# Patient Record
Sex: Female | Born: 2000 | Race: White | Hispanic: No | Marital: Single | Smoking: Former smoker
Health system: Southern US, Community
[De-identification: ages and names within clinical notes are randomized; demographics above are authoritative.]

## PROBLEM LIST (undated history)

## (undated) DIAGNOSIS — T7840XA Allergy, unspecified, initial encounter: Secondary | ICD-10-CM

## (undated) DIAGNOSIS — R630 Anorexia: Secondary | ICD-10-CM

## (undated) DIAGNOSIS — J45909 Unspecified asthma, uncomplicated: Secondary | ICD-10-CM

## (undated) DIAGNOSIS — F909 Attention-deficit hyperactivity disorder, unspecified type: Secondary | ICD-10-CM

## (undated) DIAGNOSIS — F319 Bipolar disorder, unspecified: Secondary | ICD-10-CM

## (undated) DIAGNOSIS — F32A Depression, unspecified: Secondary | ICD-10-CM

## (undated) DIAGNOSIS — F938 Other childhood emotional disorders: Secondary | ICD-10-CM

## (undated) DIAGNOSIS — L309 Dermatitis, unspecified: Secondary | ICD-10-CM

## (undated) DIAGNOSIS — L209 Atopic dermatitis, unspecified: Secondary | ICD-10-CM

## (undated) HISTORY — DX: Bipolar disorder, unspecified: F31.9

## (undated) HISTORY — DX: Depression, unspecified: F32.A

## (undated) HISTORY — PX: ADENOIDECTOMY: SUR15

## (undated) HISTORY — DX: Anorexia: R63.0

---
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
## 2021-01-04 ENCOUNTER — Ambulatory Visit: Payer: Medicaid Other | Admitting: Gastroenterology

## 2021-03-25 NOTE — Progress Notes (Deleted)
Referring Provider: Lanelle Bal, PA-C Primary Care Physician:  Lanelle Bal, PA-C Primary GI Physician: Dr. Gala Romney  No chief complaint on file.   HPI:   Deborah Fernandez is a 20 y.o. female presenting today for follow-up s/p EGD and colonoscopy.  She was last seen in our office at the time of initial consult on 07/10/2020 for lower abdominal pain, loose stools, rectal bleeding, nausea and vomiting, and dysphagia.  She reported 1 year history of postprandial crampy lower abdominal pain radiating to her back with no specific dietary triggers lasting 30 minutes to an hour.  If she can have a bowel movement, this did help, but did not alleviate her pain.  Also with intermittent bright red blood with every other bowel movement with blood in the toilet or toilet water without rectal pain.  Rectal bleeding has started about 6 months ago.  Stools tended to be on the looser side.  She would skip a day between bowel movements, but can have up to 2-3 bowel movements per day without nocturnal stools.  She had cut out a lot of red meat and dairy which was helping somewhat with her abdominal pain.  Also reported intermittent daily nausea with rare vomiting, not improved by omeprazole daily.  Also with sensation of food getting hung at the sternal notch.  Denied upper abdominal pain or weight loss.  Suspected patient had IBS, but recommended colonoscopy to rule out IBD and EGD to evaluate her nausea and intermittent vomiting as well as dysphagia, update labs, continue omeprazole 40 mg daily, start dicyclomine 10 mg up to 3 times daily before meals and at bedtime.  Prior evaluation with celiac testing and alpha gal testing negative with PCP.  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 No gallstones.   Labs completed 07/24/2020: Lipase 81, CRP 3, sed rate 26, CMP essentially normal, CBC normal.  Procedures completed  07/30/2020: Colonoscopy: Normal examined colon, nonspecific findings of the TI s/p biopsy, lax anal sphincter tone.  Pathology was benign. EGD: Normal examined esophagus s/p empiric dilation and biopsied, hiatal hernia, otherwise normal exam.  Pathology was benign.  Recommended increasing omeprazole to 40 mg twice daily.  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  Hospitalized August-October 2017.   Bipolar disorder ([place])    Depression    Eczema 01/30/2015    Past Surgical History:  Procedure Laterality Date   ADENOIDECTOMY     BIOPSY  07/30/2020   Procedure: BIOPSY;  Surgeon: Daneil Dolin, MD;  [facility]: [facility];  Service: Endoscopy;;   COLONOSCOPY WITH PROPOFOL N/A 07/30/2020   Procedure: COLONOSCOPY WITH PROPOFOL;  Surgeon: Daneil Dolin, MD;  Location: [facility];  Service: Endoscopy;  Laterality: N/A;  am or pm   ESOPHAGOGASTRODUODENOSCOPY (EGD) WITH PROPOFOL N/A 07/30/2020   Procedure: ESOPHAGOGASTRODUODENOSCOPY (EGD) WITH PROPOFOL;  Surgeon: Daneil Dolin, MD;  [facility]: [facility];  Service: Endoscopy;  Laterality: N/A;   MALONEY DILATION N/A 07/30/2020   Procedure: Venia Minks DILATION;  Surgeon: Daneil Dolin, MD;  [facility]: [facility];  Service: Endoscopy;  Laterality: N/A;    Current Outpatient Medications  Medication Sig Dispense Refill   Acetaminophen 500 MG capsule Take 1,000-2,000 mg by mouth every 6 (six) hours as needed for mild pain, fever or headache.     albuterol (VENTOLIN HFA) 108 (90 Base) MCG/ACT inhaler Inhale 2 puffs into the lungs every 6 (six) hours as needed for wheezing or  shortness of breath. 18 g 2   DUPIXENT 300 MG/2ML SOPN Inject 300 mg into the skin every 14 (fourteen) days.     fluticasone (FLOVENT HFA) 110 MCG/ACT inhaler Inhale 2 puffs into the lungs 2 (two) times daily. (Patient taking differently: Inhale 2 puffs into the lungs daily as needed (Shortness of breath).) 1 each 5   lamoTRIgine (LAMICTAL) 25 MG tablet Take 25 mg by mouth daily.     LATUDA 40 MG TABS tablet Take 40 mg by mouth at bedtime.     levonorgestrel-ethinyl estradiol (ALESSE) 0.1-20 MG-MCG tablet Take by mouth. (Patient not taking: Reported on 07/10/2020)     omeprazole (PRILOSEC) 40 MG capsule Take 40 mg by mouth daily as needed (heartburn).     ondansetron (ZOFRAN) 4 MG tablet Take 4 mg by mouth every 8 (eight) hours as needed for nausea or vomiting.     triamcinolone ointment (KENALOG) 0.1 % Apply 1 application topically 2 (two) times daily. compounded 1:1 with Eucerin (Patient taking differently: Apply 1 application topically at bedtime. compounded 1:1 with Eucerin) 453.6 g 2   No current facility-administered medications for this visit.    Allergies as of 03/26/2021 - Review Complete 10/31/2020  Allergen Reaction Noted   Clindamycin/lincomycin Hives 09/12/2018   Keflex [cephalexin] Hives 01/25/2015   Propylene glycol Hives 01/25/2015   Nickel Swelling 12/16/2015    Family History  Problem Relation Age of Onset   Drug abuse Father    Mental illness Father    Anxiety disorder Sister    Depression Sister    OCD Mother    Myasthenia gravis Mother    Anxiety disorder Mother    Allergic rhinitis Neg Hx    Asthma Neg Hx    Eczema Neg Hx    Urticaria Neg Hx    Colon cancer Neg Hx    Inflammatory bowel disease Neg Hx     Social History   Socioeconomic History   Marital status: Single    Spouse name: Not on file   Number of children: Not on file   Years of education: Not on file   Highest education level: Not on file  Occupational History   Not on file  Tobacco Use    Smoking status: Former    Types: Cigarettes   Smokeless tobacco: Never   Tobacco comments:    Pt states it was habitually, just occasionally. Reports she has quit smoking.   Vaping Use   Vaping Use: Every day  Substance and Sexual Activity   Alcohol use: Yes    Comment: rare   Drug use: Yes    Types: Marijuana, Other-see comments    Comment: "pain medications"- not currently   Sexual activity: Never    Birth control/protection: Abstinence  Other Topics Concern   Not on file  Social History Narrative   Pt lives with mother, step-father, older sister and younger brother. No pets in the home.    Social Determinants of Health   Financial Resource Strain: Not on file  Food Insecurity: Not on file  Transportation Needs: Not on file  Physical Activity: Not  on file  Stress: Not on file  Social Connections: Not on file    Review of Systems: Gen: Denies fever, chills, anorexia. Denies fatigue, weakness, weight loss.  CV: Denies chest pain, palpitations, syncope, peripheral edema, and claudication. Resp: Denies dyspnea at rest, cough, wheezing, coughing up blood, and pleurisy. GI: Denies vomiting blood, jaundice, and fecal incontinence.   Denies dysphagia or odynophagia. Derm: Denies rash, itching, dry skin Psych: Denies depression, anxiety, memory loss, confusion. No homicidal or suicidal ideation.  Heme: Denies bruising, bleeding, and enlarged lymph nodes.  Physical Exam: There were no vitals taken for this visit. General:   Alert and oriented. No distress noted. Pleasant and cooperative.  Head:  Normocephalic and atraumatic. Eyes:  Conjuctiva clear without scleral icterus. Mouth:  Oral mucosa pink and moist. Good dentition. No lesions. Heart:  S1, S2 present without murmurs appreciated. Lungs:  Clear to auscultation bilaterally. No wheezes, rales, or rhonchi. No distress.  Abdomen:  +BS, soft, non-tender and non-distended. No rebound or guarding. No HSM or masses  noted. Msk:  Symmetrical without gross deformities. Normal posture. Extremities:  Without edema. Neurologic:  Alert and  oriented x4 Psych:  Alert and cooperative. Normal mood and affect.

## 2021-03-26 ENCOUNTER — Encounter: Payer: Self-pay | Admitting: Internal Medicine

## 2021-03-26 ENCOUNTER — Ambulatory Visit: Payer: Medicaid Other | Admitting: Gastroenterology

## 2021-05-07 ENCOUNTER — Ambulatory Visit (INDEPENDENT_AMBULATORY_CARE_PROVIDER_SITE_OTHER): Payer: Medicaid Other | Admitting: Allergy & Immunology

## 2021-05-07 ENCOUNTER — Encounter: Payer: Self-pay | Admitting: Allergy & Immunology

## 2021-05-07 ENCOUNTER — Other Ambulatory Visit: Payer: Self-pay

## 2021-05-07 VITALS — BP 108/66 | HR 106 | Temp 98.0°F | Resp 18 | Ht 64.0 in | Wt 149.6 lb

## 2021-05-07 DIAGNOSIS — J3089 Other allergic rhinitis: Secondary | ICD-10-CM | POA: Diagnosis not present

## 2021-05-07 DIAGNOSIS — B999 Unspecified infectious disease: Secondary | ICD-10-CM | POA: Diagnosis not present

## 2021-05-07 DIAGNOSIS — J302 Other seasonal allergic rhinitis: Secondary | ICD-10-CM

## 2021-05-07 DIAGNOSIS — J4530 Mild persistent asthma, uncomplicated: Secondary | ICD-10-CM

## 2021-05-07 DIAGNOSIS — L2089 Other atopic dermatitis: Secondary | ICD-10-CM

## 2021-05-07 MED ORDER — FLUTICASONE PROPIONATE HFA 110 MCG/ACT IN AERO: 2.0000 | INHALATION_SPRAY | Freq: Two times a day (BID) | RESPIRATORY_TRACT | 5 refills | Status: DC

## 2021-05-07 MED ORDER — LEVOCETIRIZINE DIHYDROCHLORIDE 5 MG PO TABS: 5.0000 mg | ORAL_TABLET | Freq: Every evening | ORAL | 5 refills | Status: DC

## 2021-05-07 MED ORDER — ALBUTEROL SULFATE HFA 108 (90 BASE) MCG/ACT IN AERS: 2.0000 | INHALATION_SPRAY | Freq: Four times a day (QID) | RESPIRATORY_TRACT | 2 refills | Status: DC | PRN

## 2021-05-07 MED ORDER — TRIAMCINOLONE ACETONIDE 0.1 % EX OINT: 1.0000 "application " | TOPICAL_OINTMENT | Freq: Two times a day (BID) | CUTANEOUS | 2 refills | Status: DC

## 2021-05-07 NOTE — Progress Notes (Signed)
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 Seasonal and perennial allergic rhinitis (grasses, ragweed, weeds, trees, indoor molds, outdoor molds, dust mites, cat, dog and cockroach) - Continue to hold allergy shots (clearly they have helped you!).  - Continue with: Xyzal (levocetirizine) 5mg  tablet once daily  2. Mild persistent asthma, uncomplicated - Lung testing looked MUCH better than last time (almost normal, something that is NEVER used to describe you! LOL) - Daily controller medication(s): NOTHING - Prior to physical activity: albuterol 2 puffs 10-15 minutes before physical activity. - Rescue medications: albuterol 4 puffs every 4-6 hours as needed - Changes during respiratory infections or worsening symptoms: Add on Flovent 151mcg to 4 puffs twice daily for TWO WEEKS. - Asthma control goals:  * Full participation in all desired activities (may need albuterol before activity) * Albuterol use two time or less a week on average (not counting use with activity) * Cough interfering with sleep two time or less a month * Oral steroids no more than once a year * No hospitalizations  3. Eczema with contact dermatitis  - We will hold off Dupixent for now. - Continue to moisturize with chicken grease.  4. Recurrent infections - These seem to be under good control. - Keep it  up.  5. Return in about 6 months (around 11/04/2021).    Subjective:   Deborah Fernandez is a 21 y.o. female presenting today for follow up of  Chief Complaint  Patient presents with   Asthma    Says last summer it was good. Now that it is colder it is slightly worse.     Deborah Fernandez has a history of the following: Patient Active Problem List   Diagnosis Date Noted   Rectal bleeding 07/10/2020   Dysphagia 07/10/2020   Loose stools 07/10/2020   Mild intermittent asthma with acute exacerbation 05/29/2020   Seasonal and perennial allergic rhinitis 05/29/2020   Flexural atopic dermatitis 05/29/2020   On total parenteral nutrition    Liver abscess    Spleen, abscess    Abdominal pain    Recurrent infections    Dehydration    Diarrhea    PICC (peripherally inserted central catheter) in place    Weight loss 12/15/2015   Fever 12/15/2015   Nausea with vomiting    Pyrexia 12/14/2015   Attention deficit hyperactivity disorder (ADHD) 07/07/2015   Major depression 04/08/2015   Eczema 01/30/2015   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  Deborah Fernandez is a 21 y.o. female presenting for a follow up visit.  She was last seen in July 2022.  At that time, we continue with her allergy shots.  She was not the most compliant with regards to coming on a regular basis.  We continue with Xyzal as well as Singulair, Flonase, and Astelin.  Her asthma was under good control with albuterol  as needed and Flovent added during flares.  Her eczema was controlled with Dupixent every 2 weeks, which she administered at home.  For her recurrent infections, we recommended getting a repeat streptococcal titer.  She did get this lab drawn and she was protective to 19 out of 23 serotypes of Streptococcus pneumonia.  Since the last visit, she has largely done well.  Asthma/Respiratory Symptom History: She has not been  using her albuterol at all. Deborah Fernandez's asthma has been well controlled. She has not required rescue medication, experienced nocturnal awakenings due to lower respiratory symptoms, nor have activities of daily living been limited. She has required no Emergency Department or Urgent Care visits for her asthma. She has required zero courses of systemic steroids for asthma exacerbations since the last visit. ACT score today is 25, indicating excellent asthma symptom control. She has not needed to add her Flovent in quite some time.   Allergic Rhinitis Symptom History: She is not on her allergy shots anymore.  She feels that her allergy shots have been really well to control her symptoms.  She is now able to be around animals without any problems. She is not doing Singulair.  She is not using her nasal sprays because she does not like nasal sprays at all.  She is using her Xyzal every day.  Skin Symptom History: She last used Dupixent two months ago. She does not the pain. She has one spot on her big toe.  She really is happy with how she is doing from a skin perspective.  It certainly not at the same severity as when I saw her initially.  At that time, she had severe eczema on both of her feet. She does moisturize occasionally.  However, she tells me that she goes around [place] with her shoes off sometimes.  This is fairly gross.  She got Macrobid on Monday for a UTI. Her last sinus infection was October and she did not need antibiotics. She did have MRSA last month and this was treated with Bactroban only.  Otherwise, she has not needed systemic antibiotics for any sinopulmonary infections or skin infections.  She has not been to the emergency room or hospitalized for any infections.  She is still working at [employer].  She is actually been at this job for quite some time.  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  She does not want to do fast food long-term, but expand the bills now.  She  was in college but failed out because she never went to classes.  She is thinking of going to trade school, likely something in Risk analyst.  Her husband is doing well.  He was not impressed with the former.  This seems to be going well for him.  They found acute 1 bedroom studio apartment which is affordable at 450 a month.  Otherwise, there have been no changes to her past medical history, surgical history, family history, or social history.    Review of Systems  Constitutional: Negative.  Negative for chills, fever, malaise/fatigue and weight loss.  HENT: Negative.  Negative for congestion, ear discharge and ear pain.   Eyes:  Negative for pain, discharge and redness.  Respiratory:  Negative for cough, sputum production, shortness of breath and wheezing.   Cardiovascular: Negative.  Negative for chest pain and palpitations.  Gastrointestinal:  Negative for abdominal pain, constipation, diarrhea, heartburn, nausea and vomiting.  Skin: Negative.  Negative for itching and rash.  Neurological:  Negative for dizziness and headaches.  Endo/Heme/Allergies:  Negative for environmental allergies. Does not bruise/bleed easily.      Objective:   Blood pressure 108/66, pulse (!) 106, temperature 98 F (36.7 C), temperature source Temporal, resp. rate 18, height 5\' 4"  (1.626 m), weight 149 lb 9.6 oz (67.9 kg), SpO2 100 %. Body mass index is 25.68 kg/m.   Physical Exam Vitals reviewed.  Constitutional:      Appearance: She is well-developed.     Comments: Amusing and engaging.  HENT:     Head: Normocephalic and atraumatic.     Right Ear: Tympanic membrane, ear canal and external ear normal.     Left Ear: Tympanic membrane, ear canal and external ear normal.     Nose: No nasal deformity, septal deviation, mucosal edema or rhinorrhea.     Right Turbinates: Enlarged, swollen and pale.     Left Turbinates: Enlarged, swollen and pale.     Right Sinus: No maxillary sinus tenderness or  frontal sinus tenderness.     Left Sinus: No maxillary sinus tenderness or frontal sinus tenderness.     Mouth/Throat:     Mouth: Mucous membranes are not pale and not dry.     Pharynx: Uvula midline.  Eyes:     General: Lids are normal. No allergic shiner.       Right eye: No discharge.        Left eye: No discharge.     Conjunctiva/sclera: Conjunctivae normal.     Right eye: Right conjunctiva is not injected. No chemosis.    Left eye: Left conjunctiva is not injected. No chemosis.    Pupils: Pupils are equal, round, and reactive to light.  Cardiovascular:     Rate and Rhythm: Normal rate and regular rhythm.     Heart sounds: Normal heart sounds.  Pulmonary:     Effort: Pulmonary effort is normal. No tachypnea, accessory muscle usage or respiratory distress.     Breath sounds: Normal breath sounds. No wheezing, rhonchi or rales.  Chest:     Chest wall: No tenderness.  Lymphadenopathy:     Cervical: No cervical adenopathy.  Skin:    General: Skin is warm.     Capillary Refill: Capillary refill takes less than 2 seconds.     Coloration: Skin is not pale.     Findings: No abrasion, erythema, petechiae or rash. Rash is not papular, urticarial or vesicular.     Comments: Skin looks fairly good.  She does have eczematous patch on each, but otherwise she looks great.  Neurological:     Mental Status: She is alert.  Psychiatric:        Behavior: Behavior is cooperative.     Diagnostic studies:    Spirometry: results normal (FEV1: 2.60/78%, FVC: 3.55/94%, FEV1/FVC: 73%).    Spirometry consistent with mild obstructive disease.    Allergy Studies: none         Salvatore Marvel, MD  [facility]

## 2021-05-07 NOTE — Patient Instructions (Addendum)
1. Seasonal and perennial allergic rhinitis (grasses, ragweed, weeds, trees, indoor molds, outdoor molds, dust mites, cat, dog and cockroach) - Continue to hold allergy shots (clearly they have helped you!).  - Continue with: Xyzal (levocetirizine) 5mg  tablet once daily  2. Mild persistent asthma, uncomplicated - Lung testing looked MUCH better than last time (almost normal, something that is NEVER used to describe you! LOL) - Daily controller medication(s): NOTHING - Prior to physical activity: albuterol 2 puffs 10-15 minutes before physical activity. - Rescue medications: albuterol 4 puffs every 4-6 hours as needed - Changes during respiratory infections or worsening symptoms: Add on Flovent to 4 puffs twice daily for TWO WEEKS. - Asthma control goals:  * Full participation in all desired activities (may need albuterol before activity) * Albuterol use two time or less a week on average (not counting use with activity) * Cough interfering with sleep two time or less a month * Oral steroids no more than once a year * No hospitalizations  3. Eczema with contact dermatitis  - We will hold off Dupixent for now. - Continue to moisturize with chicken grease.  4. Recurrent infections - These seem to be under good control. - Keep it up.  5. Return in about 6 months (around 11/04/2021).    Please inform 11/06/2021 of any Emergency Department visits, hospitalizations, or changes in symptoms. Call us before going to the ED for breathing or allergy symptoms since we might be able to fit you in for a sick visit. Feel free to contact us anytime with any questions, problems, or concerns.  It was a pleasure to see you again today!  Websites that have reliable patient information: 1. American Academy of Asthma, Allergy, and Immunology: www.aaaai.org 2. Food Allergy Research and Education (FARE): foodallergy.org 3. Mothers of Asthmatics: http://www.asthmacommunitynetwork.org 4. American College of  Allergy, Asthma, and Immunology: www.acaai.org   COVID-19 Vaccine Information can be found at: [place] For questions related to vaccine distribution or appointments, please email [email or web address] or call [phone].   We realize that you might be concerned about having an allergic reaction to the COVID19 vaccines. To help with that concern, WE ARE OFFERING THE COVID19 VACCINES IN OUR OFFICE! Ask the front desk for dates!     Like [phone] on [place] and Instagram for our latest updates!      A healthy democracy works best when [employer] participate! Make sure you are registered to vote! If you have moved or changed any of your contact information, you will need to get this updated before voting!  In some cases, you MAY be able to register to vote online: [employer]

## 2021-08-04 ENCOUNTER — Other Ambulatory Visit: Payer: Self-pay | Admitting: Allergy & Immunology

## 2021-11-05 ENCOUNTER — Encounter: Payer: Self-pay | Admitting: Allergy & Immunology

## 2021-11-05 ENCOUNTER — Ambulatory Visit (INDEPENDENT_AMBULATORY_CARE_PROVIDER_SITE_OTHER): Payer: Medicaid Other | Admitting: Allergy & Immunology

## 2021-11-05 VITALS — BP 108/82 | HR 85 | Temp 97.8°F | Resp 16 | Ht 62.75 in | Wt 133.5 lb

## 2021-11-05 DIAGNOSIS — J302 Other seasonal allergic rhinitis: Secondary | ICD-10-CM

## 2021-11-05 DIAGNOSIS — J4530 Mild persistent asthma, uncomplicated: Secondary | ICD-10-CM | POA: Diagnosis not present

## 2021-11-05 DIAGNOSIS — B999 Unspecified infectious disease: Secondary | ICD-10-CM

## 2021-11-05 DIAGNOSIS — J3089 Other allergic rhinitis: Secondary | ICD-10-CM

## 2021-11-05 DIAGNOSIS — L2089 Other atopic dermatitis: Secondary | ICD-10-CM

## 2021-11-05 MED ORDER — LEVOCETIRIZINE DIHYDROCHLORIDE 5 MG PO TABS: 5.0000 mg | ORAL_TABLET | Freq: Every evening | ORAL | 10 refills | Status: DC

## 2021-11-05 MED ORDER — ALBUTEROL SULFATE HFA 108 (90 BASE) MCG/ACT IN AERS: INHALATION_SPRAY | RESPIRATORY_TRACT | 2 refills | Status: DC

## 2021-11-05 MED ORDER — FLUTICASONE PROPIONATE HFA 110 MCG/ACT IN AERO: INHALATION_SPRAY | RESPIRATORY_TRACT | 5 refills | Status: DC

## 2021-11-05 NOTE — Patient Instructions (Addendum)
1. Seasonal and perennial allergic rhinitis (grasses, ragweed, weeds, trees, indoor molds, outdoor molds, dust mites, cat, dog and cockroach) - You are doing so good!  - Continue with: Xyzal (levocetirizine) 5mg  tablet once daily  2. Mild persistent asthma, uncomplicated - Lung testing looks good today.  - Daily controller medication(s): NOTHING - Prior to physical activity: albuterol 2 puffs 10-15 minutes before physical activity. - Rescue medications: albuterol 4 puffs every 4-6 hours as needed - Changes during respiratory infections or worsening symptoms: Add on Flovent to 4 puffs twice daily for TWO WEEKS. - Asthma control goals:  * Full participation in all desired activities (may need albuterol before activity) * Albuterol use two time or less a week on average (not counting use with activity) * Cough interfering with sleep two time or less a month * Oral steroids no more than once a year * No hospitalizations  3. Eczema with contact dermatitis  - We will hold off Dupixent for now. - Continue to moisturize with chicken grease. - You have such nice feet today!     4. Recurrent infections - These seem to be under good control. - Keep it up.  5. Return in about 1 year (around 11/06/2022).    Please inform 11/08/2022 of any Emergency Department visits, hospitalizations, or changes in symptoms. Call us before going to the ED for breathing or allergy symptoms since we might be able to fit you in for a sick visit. Feel free to contact us anytime with any questions, problems, or concerns.  It was a pleasure to see you again today!  Websites that have reliable patient information: 1. American Academy of Asthma, Allergy, and Immunology: www.aaaai.org 2. Food Allergy Research and Education (FARE): foodallergy.org 3. Mothers of Asthmatics: http://www.asthmacommunitynetwork.org 4. American College of Allergy, Asthma, and Immunology: www.acaai.org   COVID-19 Vaccine Information can be  found at: [place] For questions related to vaccine distribution or appointments, please email [email or web address] or call [phone].   We realize that you might be concerned about having an allergic reaction to the COVID19 vaccines. To help with that concern, WE ARE OFFERING THE COVID19 VACCINES IN OUR OFFICE! Ask the front desk for dates!     "Like" [phone] on Facebook and Instagram for our latest updates!      A healthy democracy works best when [place] participate! Make sure you are registered to vote! If you have moved or changed any of your contact information, you will need to get this updated before voting!  In some cases, you MAY be able to register to vote online: [employer]

## 2021-11-05 NOTE — Progress Notes (Signed)
FOLLOW UP  Date of Service/Encounter:  11/05/21   Assessment:   Mild persistent allergic atopic asthma without complication  Seasonal and perennial allergic rhinitis (grasses, ragweed, weeds, trees, indoor molds, outdoor molds, dust mites, cat, dog and cockroach)  Recurrent infections - with excellent response to Pneumovax  Flexural atopic dermatitis - well controlled following Dupixent and two years of allergen immunotherapy   Plan/Recommendations:   1. Seasonal and perennial allergic rhinitis (grasses, ragweed, weeds, trees, indoor molds, outdoor molds, dust mites, cat, dog and cockroach) - You are doing so good!  - Continue with: Xyzal (levocetirizine) 5mg  tablet once daily  2. Mild persistent asthma, uncomplicated - Lung testing looks good today.  - Daily controller medication(s): NOTHING - Prior to physical activity: albuterol 2 puffs 10-15 minutes before physical activity. - Rescue medications: albuterol 4 puffs every 4-6 hours as needed - Changes during respiratory infections or worsening symptoms: Add on Flovent to 4 puffs twice daily for TWO WEEKS. - Asthma control goals:  * Full participation in all desired activities (may need albuterol before activity) * Albuterol use two time or less a week on average (not counting use with activity) * Cough interfering with sleep two time or less a month * Oral steroids no more than once a year * No hospitalizations  3. Eczema with contact dermatitis  - We will hold off Dupixent for now. - Continue to moisturize with chicken grease. - Skin looks amazing today.  4. Recurrent infections - These seem to be under good control. - Keep it up.  5. Return in about 1 year (around 11/06/2022).   Subjective:   Deborah Fernandez is a 21 y.o. female presenting today for follow up of  Chief Complaint  Patient presents with   Asthma    Asthma is doing good. With the air quality she has used her alb. A few times.    Eczema     Eczema has been clear most of the time.    Allergic Rhinitis     Some days she is sneezing but overall doing well.     Deborah Fernandez has a history of the following: Patient Active Problem List   Diagnosis Date Noted   Rectal bleeding 07/10/2020   Dysphagia 07/10/2020   Loose stools 07/10/2020   Mild intermittent asthma with acute exacerbation 05/29/2020   Seasonal and perennial allergic rhinitis 05/29/2020   Flexural atopic dermatitis 05/29/2020   On total parenteral nutrition    Liver abscess    Spleen, abscess    Abdominal pain    Recurrent infections    Dehydration    Diarrhea    PICC (peripherally inserted central catheter) in place    Weight loss 12/15/2015   Fever 12/15/2015   Nausea with vomiting    Pyrexia 12/14/2015   Attention deficit hyperactivity disorder (ADHD) 07/07/2015   Major depression 04/08/2015   Eczema 01/30/2015   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  Deborah Fernandez is a 21 y.o. female presenting for a follow up visit. She was last seen in January 2023. At that time, she was doing well without her allergy shots. We continued with levocetirizine 5 mg daily as well as albuterol as needed for her asthma. Lung testing looked excellent. For her eczema, we continued with the use of militarization. She had previously been on Dupixent with excellent results. Recurrent infections were much better following her vaccination with Pneumovax.  Since the last visit, she has done very well.   Asthma/Respiratory Symptom History: She has not been using her albuterol much at all. Deborah Fernandez's asthma has been well controlled. She has not required rescue medication, experienced nocturnal awakenings due to lower respiratory symptoms, nor have activities of daily living been limited. She has required no Emergency Department or Urgent Care visits for her asthma. She has required zero  courses of systemic steroids for asthma exacerbations since the last visit. ACT score today is 25, indicating excellent asthma symptom control. She has not been on prednisone at alol since the last visit. She denies any problems with her breathing, despite her continued smoking.   Allergic Rhinitis Symptom History: For her allergic rhinitis, she remains on the levocetirzine daily. She has not been on any antibiotics at all since the last visit. She has not been using her nasal steroid at all.  She was on allergy shots for a couple of years in total.   Skin Symptom History: Skin has been under good control with her current regimen. She does not use her Dupxient at all. Her feet are looking clear without any inflammation. She does not need refills of her topical treatments at all.   She continues to work at [employer] which is the longest place that she has worked since I have known her. She is coming up on one year of marriage. She dabbles in illicit drugs including mushrooms, but she uses pot much more regularly. She is planning to do some type of higher education, but she is still making up her mind. Her husband continues to apprentice with a Nutritional therapist.   Otherwise, there have been no changes to her past medical history, surgical history, family history, or social history.    Review of Systems  Constitutional: Negative.  Negative for chills, fever, malaise/fatigue and weight loss.  HENT: Negative.  Negative for congestion, ear discharge and ear pain.   Eyes:  Negative for pain, discharge and redness.  Respiratory:  Negative for cough, sputum production, shortness of breath and wheezing.   Cardiovascular: Negative.  Negative for chest pain and palpitations.  Gastrointestinal:  Negative for abdominal pain, constipation, diarrhea, heartburn, nausea and vomiting.  Skin: Negative.  Negative for itching and rash.  Neurological:  Negative for dizziness and headaches.  Endo/Heme/Allergies:  Negative for  environmental allergies. Does not bruise/bleed easily.       Objective:   Blood pressure 108/82, pulse 85, temperature 97.8 F (36.6 C), resp. rate 16, height 5' 2.75" (1.594 m), weight 133 lb 8 oz (60.6 kg), SpO2 100 %. Body mass index is 23.84 kg/m.    Physical Exam Vitals reviewed.  Constitutional:      Appearance: She is well-developed.     Comments: Amusing and engaging.  HENT:     Head: Normocephalic and atraumatic.     Right Ear: Tympanic membrane, ear canal and external ear normal.     Left Ear: Tympanic membrane, ear canal and external ear normal.     Nose: No nasal deformity, septal deviation, mucosal edema or rhinorrhea.     Right Turbinates: Enlarged, swollen and pale.     Left Turbinates: Enlarged, swollen and pale.     Right Sinus: No maxillary sinus tenderness or frontal sinus tenderness.     Left Sinus: No maxillary sinus tenderness or frontal sinus tenderness.     Mouth/Throat:     Mouth: Mucous membranes are not pale and not dry.     Pharynx: Uvula midline.  Eyes:     General: Lids are normal. No allergic shiner.       Right eye: No discharge.        Left eye: No discharge.     Conjunctiva/sclera: Conjunctivae normal.     Right eye: Right conjunctiva is not injected. No chemosis.    Left eye: Left conjunctiva is not injected. No chemosis.    Pupils: Pupils are equal, round, and reactive to light.  Cardiovascular:     Rate and Rhythm: Normal rate and regular rhythm.     Heart sounds: Normal heart sounds.  Pulmonary:     Effort: Pulmonary effort is normal. No tachypnea, accessory muscle usage or respiratory distress.     Breath sounds: Normal breath sounds. No wheezing, rhonchi or rales.  Chest:     Chest wall: No tenderness.  Lymphadenopathy:     Cervical: No cervical adenopathy.  Skin:    General: Skin is warm.     Capillary Refill: Capillary refill takes less than 2 seconds.     Coloration: Skin is not pale.     Findings: No abrasion, erythema,  petechiae or rash. Rash is not papular, urticarial or vesicular.     Comments: Skin looks fairly good.  She does have eczematous patch on each, but otherwise she looks great.  Neurological:     Mental Status: She is alert.  Psychiatric:        Behavior: Behavior is cooperative.      Diagnostic studies:    Spirometry: Normal FEV1, FVC, and FEV1/FVC ratio. There is no scooping suggestive of obstructive disease.        Malachi Bonds, MD  [facility]

## 2021-11-07 ENCOUNTER — Encounter: Payer: Self-pay | Admitting: Allergy & Immunology

## 2022-01-29 ENCOUNTER — Other Ambulatory Visit: Payer: Self-pay | Admitting: Allergy & Immunology

## 2022-02-11 ENCOUNTER — Telehealth: Payer: Self-pay

## 2022-02-11 MED ORDER — BUDESONIDE 180 MCG/ACT IN AEPB: 1.0000 | INHALATION_SPRAY | Freq: Two times a day (BID) | RESPIRATORY_TRACT | 5 refills | Status: DC

## 2022-02-11 MED ORDER — ALBUTEROL SULFATE HFA 108 (90 BASE) MCG/ACT IN AERS: 2.0000 | INHALATION_SPRAY | Freq: Four times a day (QID) | RESPIRATORY_TRACT | 2 refills | Status: DC | PRN

## 2022-02-11 NOTE — Telephone Encounter (Signed)
I received a call from Nambe at [facility]. The patient was seen by their office today due to increased chest tightness . Patient is currently using her rescue inhaler multiple times a day. Launa Grill, PA is wondering if she can restart a rescue inhaler? If so which one and also patient has pending OB labs to see if she is pregnant.   Please call Judson Roch as she will update the patient and send in the medication refill.   Clarise Cruz @ [facility] [phone]

## 2022-02-11 NOTE — Telephone Encounter (Signed)
That is a good idea. I would do Pulmicort 166mcg one puff BID (since this is safe to use during pregnancy).   I would also add on albuterol back onto her regimen.   I called the number and the office was closed. Therefore I called Miller herself instead and told her the plan.   Salvatore Marvel, MD [facility]

## 2022-02-16 NOTE — Telephone Encounter (Signed)
[facility] called back and has been informed.

## 2022-03-16 NOTE — Telephone Encounter (Signed)
Patient called today due to being seen in the ED for an asthma flare. Patient was prescribed ned solution but does not have a nebulizer. She was told to call her asthma doctor for one. I have one for her ready for pick up at the [facility] office. Patient plans to pick it up a little after 1:30pm.

## 2022-03-17 NOTE — Telephone Encounter (Signed)
Lm for pt to call us back about getting on schedule to do televisit today

## 2022-03-17 NOTE — Telephone Encounter (Signed)
Do we need to get her in to discuss her medications? She seems to be flaring a lot more. We could do a televisit today if needed.   Deborah Bonds, MD [facility]

## 2022-03-18 NOTE — Telephone Encounter (Signed)
Called and spoke with patient and she states that she is feeling much better right now. I did get her scheduled to see Dr. Allena Katz this coming Monday in [place] to evaluate her.

## 2022-03-18 NOTE — Telephone Encounter (Signed)
Great - sounds good! I bet Dr. Allena Katz will love Potato Girl! I think her asthma has just flared due to her pregnancy, but we will get to work getting her on the right medications.  Dr. Allena Katz - please wish Potato Girl a happy birthday for me when you see her on Monday!   Malachi Bonds, MD [facility]

## 2022-03-21 ENCOUNTER — Encounter: Payer: Self-pay | Admitting: Internal Medicine

## 2022-03-21 ENCOUNTER — Telehealth: Payer: Self-pay

## 2022-03-21 ENCOUNTER — Ambulatory Visit (INDEPENDENT_AMBULATORY_CARE_PROVIDER_SITE_OTHER): Payer: Medicaid Other | Admitting: Internal Medicine

## 2022-03-21 VITALS — BP 110/60 | HR 95 | Temp 98.7°F | Resp 18 | Ht 63.74 in | Wt 136.4 lb

## 2022-03-21 DIAGNOSIS — J4530 Mild persistent asthma, uncomplicated: Secondary | ICD-10-CM

## 2022-03-21 DIAGNOSIS — J3089 Other allergic rhinitis: Secondary | ICD-10-CM

## 2022-03-21 DIAGNOSIS — J302 Other seasonal allergic rhinitis: Secondary | ICD-10-CM

## 2022-03-21 MED ORDER — ALBUTEROL SULFATE (2.5 MG/3ML) 0.083% IN NEBU: 2.5000 mg | INHALATION_SOLUTION | Freq: Four times a day (QID) | RESPIRATORY_TRACT | 1 refills | Status: DC | PRN

## 2022-03-21 MED ORDER — ALBUTEROL SULFATE HFA 108 (90 BASE) MCG/ACT IN AERS: 2.0000 | INHALATION_SPRAY | Freq: Four times a day (QID) | RESPIRATORY_TRACT | 2 refills | Status: DC | PRN

## 2022-03-21 MED ORDER — PULMICORT FLEXHALER 180 MCG/ACT IN AEPB: INHALATION_SPRAY | RESPIRATORY_TRACT | 5 refills | Status: DC

## 2022-03-21 NOTE — Telephone Encounter (Signed)
Per Dr.Patel. I called [facility] and asked why the Pulmicort Inhaler could not be dispensed the last time she tried to pick it up. Pharmacist said that a PA is required for the budesonide(pulmicort) inhaler.

## 2022-03-21 NOTE — Telephone Encounter (Signed)
Pulmicort Flexhaler 180 mcg PA...  KEY: [id number] PA Case ID: [id number] created: 03/21/22   Approved  today  PA Case: [id number], Status: Approved, Coverage Starts on: 03/21/2022 12:00:00 AM, Coverage Ends on: 03/21/2023 12:00:00 AM.  Sending prescription to pharmacy - called and advised patient .

## 2022-03-21 NOTE — Progress Notes (Signed)
FOLLOW UP Date of Service/Encounter:  03/21/22   Subjective:  Deborah Fernandez (DOB: 10-29-2000) is a 21 y.o. female who [facility] for follow up for an acute visit after recent asthma exacerbation.     History obtained from: chart review and patient.  Last saw Dr. Dellis Anes 10/2021 : Mild persistent asthma: PRN albuterol, normal spirometry Allergic rhinitis: Xyzal 5mg  daily. Eczema: moisturizing  Since last visit, she reports her asthma has not been doing well.  She used to be on Dupixent and AIT and reports she was doing very well at the time.  However, since seeing Dr. and with her recent pregnancy (10 weeks), she has been having more episodes of shortness of breath and chest tightness.  She has tried Flovent inhaler which she had from prior visit but it does not help much.  She was seen 03/15/2022 in ER for asthma exacerbation and had wheezing on exam; she was given Duonebs and discharged home, did not require prednisone.    In terms of her allergic rhinitis, she reports doing well. She is not taking anything for it currently and has not had much congestion/rhinorrhea/itchy watery eyes.  Was previously on Xyzal 5mg  PRN.   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  Hospitalized August-October 2017.   Bipolar disorder (HCC)    Depression    Eczema 01/30/2015    Objective:  BP 110/60   Pulse 95   Temp 98.7 F (37.1 C) (Temporal)   Resp 18   Ht 5' 3.74" (1.619 m)   Wt 136 lb 6.4 oz (61.9 kg)   SpO2 98%   BMI 23.60 kg/m  Body mass index is 23.6 kg/m. Physical Exam: GEN: alert, well developed HEENT: clear conjunctiva, TM grey and translucent,  nose with moderate inferior turbinate hypertrophy, pink nasal mucosa, no rhinorrhea, no cobblestoning HEART: regular rate and rhythm, no murmur LUNGS: clear to auscultation bilaterally, no coughing, unlabored respiration SKIN: no rashes or lesions   Spirometry:  Tracings reviewed. Her effort: Good reproducible efforts. FVC: 3.25L FEV1: 2.59L, 80% predicted FEV1/FVC ratio: 80% Interpretation: Spirometry consistent with normal pattern.  Please see scanned spirometry results for details.  Assessment/Plan   Mild Persistent Asthma, Recent Exacerbation in Pregnancy, Uncontrolled  - MDI technique discussed.   - Maintenance inhaler:  start Pulmicort 01-12-2005 1 puff twice daily.   Has tried Flovent in the past without significant improvement.   - Rescue inhaler: Albuterol 2 puffs via spacer or 1 vial via nebulizer every 4-6 hours as needed for respiratory symptoms of cough, shortness of breath, or wheezing Asthma control goals:  Full participation in all desired activities (may need albuterol before activity) Albuterol use two times or less a week on average (not counting use with activity) Cough interfering with sleep two times or less a month Oral steroids no more than once a year No hospitalizations   Allergic Rhinitis: - Positive skin test 10/2019: grasses, ragweed, weeds, trees, indoor molds, outdoor molds, dust mites, cat, dog and cockroach  - Avoidance measures discussed. - Use nasal saline rinses before nose sprays such as with Neilmed Sinus Rinse.  Use distilled water.   - Stop Xyzal.  Okay to try Zyrtec 5mg  as needed if your symptoms are uncontrolled.    Return in about 2 months (around 05/22/2022). Harlon Flor, MD  [facility]

## 2022-03-21 NOTE — Patient Instructions (Addendum)
Asthma: - MDI technique discussed.   - Maintenance inhaler:  Pulmicort 1 puff twice daily.  - Rescue inhaler: Albuterol 2 puffs via spacer or 1 vial via nebulizer every 4-6 hours as needed for respiratory symptoms of cough, shortness of breath, or wheezing Asthma control goals:  Full participation in all desired activities (may need albuterol before activity) Albuterol use two times or less a week on average (not counting use with activity) Cough interfering with sleep two times or less a month Oral steroids no more than once a year No hospitalizations   Allergic Rhinitis: - Positive skin test 10/2019: grasses, ragweed, weeds, trees, indoor molds, outdoor molds, dust mites, cat, dog and cockroach  - Avoidance measures discussed. - Use nasal saline rinses before nose sprays such as with Neilmed Sinus Rinse.  Use distilled water.   - Stop Xyzal.  Okay to try Zyrtec 5mg  as needed if your symptoms are uncontrolled.

## 2022-05-25 ENCOUNTER — Encounter: Payer: Self-pay | Admitting: Allergy & Immunology

## 2022-05-25 ENCOUNTER — Other Ambulatory Visit: Payer: Self-pay

## 2022-05-25 ENCOUNTER — Telehealth: Payer: Self-pay | Admitting: *Deleted

## 2022-05-25 ENCOUNTER — Ambulatory Visit (INDEPENDENT_AMBULATORY_CARE_PROVIDER_SITE_OTHER): Payer: 59 | Admitting: Allergy & Immunology

## 2022-05-25 VITALS — BP 120/62 | HR 131 | Temp 98.4°F | Resp 20 | Ht 63.0 in | Wt 145.0 lb

## 2022-05-25 DIAGNOSIS — R0602 Shortness of breath: Secondary | ICD-10-CM | POA: Diagnosis not present

## 2022-05-25 DIAGNOSIS — J3089 Other allergic rhinitis: Secondary | ICD-10-CM | POA: Diagnosis not present

## 2022-05-25 DIAGNOSIS — J4530 Mild persistent asthma, uncomplicated: Secondary | ICD-10-CM | POA: Diagnosis not present

## 2022-05-25 DIAGNOSIS — J302 Other seasonal allergic rhinitis: Secondary | ICD-10-CM

## 2022-05-25 MED ORDER — ALBUTEROL SULFATE (2.5 MG/3ML) 0.083% IN NEBU: 2.5000 mg | INHALATION_SOLUTION | Freq: Four times a day (QID) | RESPIRATORY_TRACT | 1 refills | Status: DC | PRN

## 2022-05-25 MED ORDER — BUDESONIDE-FORMOTEROL FUMARATE 80-4.5 MCG/ACT IN AERO: 2.0000 | INHALATION_SPRAY | Freq: Two times a day (BID) | RESPIRATORY_TRACT | 5 refills | Status: DC

## 2022-05-25 MED ORDER — NEBULIZER MASK ADULT MISC: 1.0000 | 1 refills | Status: AC | PRN

## 2022-05-25 MED ORDER — LEVOCETIRIZINE DIHYDROCHLORIDE 5 MG PO TABS: 5.0000 mg | ORAL_TABLET | Freq: Every day | ORAL | 5 refills | Status: DC | PRN

## 2022-05-25 MED ORDER — ALBUTEROL SULFATE HFA 108 (90 BASE) MCG/ACT IN AERS: 2.0000 | INHALATION_SPRAY | Freq: Four times a day (QID) | RESPIRATORY_TRACT | 2 refills | Status: DC | PRN

## 2022-05-25 MED ORDER — SPACER/AERO-HOLDING CHAMBERS DEVI: 1.0000 | 1 refills | Status: AC

## 2022-05-25 NOTE — Progress Notes (Unsigned)
FOLLOW UP  Date of Service/Encounter:  05/26/22   Assessment:   Mild persistent allergic atopic asthma without complication  Chest tightness (preceded her pregnancy) - unclear response to albuterol   Seasonal and perennial allergic rhinitis (grasses, ragweed, weeds, trees, indoor molds, outdoor molds, dust mites, cat, dog and cockroach)   Recurrent infections - with excellent response to Pneumovax   Flexural atopic dermatitis - well controlled following Dupixent and two years of allergen immunotherapy    Plan/Recommendations:   1. Seasonal and perennial allergic rhinitis (grasses, ragweed, weeds, trees, indoor molds, outdoor molds, dust mites, cat, dog and cockroach) - You are doing so good!  - We will talk about starting allergy shots again once you give birth.  - Continue with: Xyzal (levocetirizine) 5mg  tablet once daily  2. Mild persistent asthma, uncomplicated - Lung testing looks a little bit worse and it did not change much with the albuterol treatment. - I am going to get an echocardiogram to make sure we are not dealing with a heart problem.  - They should call you to schedule this.  - Stop the Pulmicort and start low dose Symbicort instead since this device is easier to use. - Use a spacer with the Symbicort.  - Daily controller medication(s): Symbicort 62mcg two puffs twice daily with spacer - Prior to physical activity: albuterol 2 puffs 10-15 minutes before physical activity. - Rescue medications: albuterol 4 puffs every 4-6 hours as needed - Asthma control goals:  * Full participation in all desired activities (may need albuterol before activity) * Albuterol use two time or less a week on average (not counting use with activity) * Cough interfering with sleep two time or less a month * Oral steroids no more than once a year * No hospitalizations  3. Eczema with contact dermatitis  - We will hold off Dupixent for now. - Continue with the topical treatments  as needed.   4. Recurrent infections - These seem to be under good control. - Keep it up.  5. Return in about 2 months (around 07/24/2022).     Subjective:   Deborah Fernandez is a 22 y.o. female presenting today for follow up of  Chief Complaint  Patient presents with   Follow-up    Follow up pt states she has been doing good about has good as the the last time she was here with her asthma and pt states her eczema is doing pretty good.    Deborah Fernandez has a history of the following: Patient Active Problem List   Diagnosis Date Noted   Rectal bleeding 07/10/2020   Dysphagia 07/10/2020   Loose stools 07/10/2020   Mild intermittent asthma with acute exacerbation 05/29/2020   Seasonal and perennial allergic rhinitis 05/29/2020   Flexural atopic dermatitis 05/29/2020   On total parenteral nutrition    Liver abscess    Spleen, abscess    Abdominal pain    Recurrent infections    Dehydration    Diarrhea    PICC (peripherally inserted central catheter) in place    Weight loss 12/15/2015   Fever 12/15/2015   Nausea with vomiting    Pyrexia 12/14/2015   Attention deficit hyperactivity disorder (ADHD) 07/07/2015   Major depression 04/08/2015   Eczema 01/30/2015   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  Deborah Fernandez is a 22 y.o. female presenting for a follow up visit. She was last seen in  December 2023 by Dr. Posey Pronto.  At that time, she was started on Pulmicort 180 mcg 1 puff twice daily.  For her allergic rhinitis, her levocetirizine was stopped and she was continued on Zyrtec 5 mg daily as needed.  Since last visit, she has largely remained the same.  From a pregnancy perspective, she is doing well - now at [redacted] weeks gestation. She is having a baby girl. She did have a gender reveal party, but they are doing a baby shower.  She frankly thinks both are a little bit overrated.  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  Asthma/Respiratory Symptom History: She has been on the Pulmicort one puff once daily. It is not working appropriately. She says that the numbers do not move at all.  She has tried watching [employer] without success.  She would like to go back to metered-dose inhaler.  She thinks that this is more than just asthma. She thinks that her COVID made things really bad. She was having issues before pregnancy. She is having SOB even with doing nothing at all. Pregnancy did not make it worse at all. She does get out of breath more, but it is not a consistent thing. She just feels as if there is a weight on top of it. She did not get an echocardiogram at all. She was told to talk to her OB about it.  But then her OB told her to talk to her PCP about it.  She does not have any cardiac history.  Allergic Rhinitis Symptom History: Her allergic rhinitis symptoms are under okay control.  She is interested in getting back on allergy shots.  I explained to her that this would have to wait until after her baby was born.  She felt a lot better when she was on allergy shots, but she became inconsistent after a period of time.  I would estimate that she was on maintenance for about 1 year tops.  Skin Symptom History: Her skin is under fair control.  She still has some dryness of her bilateral feet, but overall, it is much better than it was previously.  Otherwise, there have been no changes to her past medical history, surgical history, family history, or social history.    Review of Systems  Constitutional: Negative.  Negative for chills, fever, malaise/fatigue and weight loss.  HENT:  Positive for congestion. Negative for ear discharge, ear pain and sinus pain.   Eyes:  Negative for pain, discharge and redness.  Respiratory:  Negative for cough, sputum production, shortness of breath and wheezing.   Cardiovascular:  Positive for chest pain and palpitations.   Gastrointestinal:  Negative for abdominal pain, constipation, diarrhea, heartburn, nausea and vomiting.  Skin: Negative.  Negative for itching and rash.  Neurological:  Negative for dizziness and headaches.  Endo/Heme/Allergies:  Negative for environmental allergies. Does not bruise/bleed easily.       Objective:   Blood pressure 120/62, pulse (!) 131, temperature 98.4 F (36.9 C), resp. rate 20, height 5\' 3"  (1.6 m), weight 145 lb (65.8 kg), SpO2 96 %. Body mass index is 25.69 kg/m.    Physical Exam Vitals reviewed.  Constitutional:      Appearance: She is well-developed.     Comments: Amusing and engaging.  HENT:     Head: Normocephalic and atraumatic.     Right Ear: Tympanic membrane, ear canal and external ear normal.     Left Ear: Tympanic membrane, ear  canal and external ear normal.     Nose: No nasal deformity, septal deviation, mucosal edema or rhinorrhea.     Right Turbinates: Enlarged, swollen and pale.     Left Turbinates: Enlarged, swollen and pale.     Right Sinus: No maxillary sinus tenderness or frontal sinus tenderness.     Left Sinus: No maxillary sinus tenderness or frontal sinus tenderness.     Comments: No nasal polyps.    Mouth/Throat:     Lips: Pink.     Mouth: Mucous membranes are moist. Mucous membranes are not pale and not dry.     Pharynx: Uvula midline.     Comments: Moderate cobblestoning in the posterior oropharynx. Eyes:     General: Lids are normal. No allergic shiner.       Right eye: No discharge.        Left eye: No discharge.     Conjunctiva/sclera: Conjunctivae normal.     Right eye: Right conjunctiva is not injected. No chemosis.    Left eye: Left conjunctiva is not injected. No chemosis.    Pupils: Pupils are equal, round, and reactive to light.  Cardiovascular:     Rate and Rhythm: Normal rate and regular rhythm.     Heart sounds: Normal heart sounds.  Pulmonary:     Effort: Pulmonary effort is normal. No tachypnea, accessory  muscle usage or respiratory distress.     Breath sounds: Normal breath sounds. No wheezing, rhonchi or rales.     Comments: Moving air well in all lung fields. Chest:     Chest wall: No tenderness.  Lymphadenopathy:     Cervical: No cervical adenopathy.  Skin:    General: Skin is warm.     Capillary Refill: Capillary refill takes less than 2 seconds.     Coloration: Skin is not pale.     Findings: No abrasion, erythema, petechiae or rash. Rash is not papular, urticarial or vesicular.     Comments: Skin looks fairly good.  She does have eczematous patch on each, but otherwise she looks great.  Neurological:     Mental Status: She is alert.  Psychiatric:        Behavior: Behavior is cooperative.      Diagnostic studies:    Spirometry: results normal (FEV1: 2.30/71%, FVC: 3.11/85%, FEV1/FVC: 74%).    Spirometry consistent with mild obstructive disease. Albuterol four puffs via MDI treatment given in clinic with no improvement.  Allergy Studies: none       Salvatore Marvel, MD  [facility]

## 2022-05-25 NOTE — Patient Instructions (Addendum)
1. Seasonal and perennial allergic rhinitis (grasses, ragweed, weeds, trees, indoor molds, outdoor molds, dust mites, cat, dog and cockroach) - You are doing so good!  - We will talk about starting allergy shots again once you give birth.  - Continue with: Xyzal (levocetirizine) 5mg  tablet once daily  2. Mild persistent asthma, uncomplicated - Lung testing looks a little bit worse and it did not change much with the albuterol treatment. - I am going to get an echocardiogram to make sure we are not dealing with a heart problem.  - They should call you to schedule this.  - Stop the Pulmicort and start low dose Symbicort instead since this device is easier to use. - Use a spacer with the Symbicort.  - Daily controller medication(s): Symbicort 24mcg two puffs twice daily with spacer - Prior to physical activity: albuterol 2 puffs 10-15 minutes before physical activity. - Rescue medications: albuterol 4 puffs every 4-6 hours as needed - Asthma control goals:  * Full participation in all desired activities (may need albuterol before activity) * Albuterol use two time or less a week on average (not counting use with activity) * Cough interfering with sleep two time or less a month * Oral steroids no more than once a year * No hospitalizations  3. Eczema with contact dermatitis  - We will hold off Dupixent for now. - Continue with the topical treatments as needed.     4. Recurrent infections - These seem to be under good control. - Keep it up.  5. Return in about 2 months (around 07/24/2022).    Please inform us of any Emergency Department visits, hospitalizations, or changes in symptoms. Call us before going to the ED for breathing or allergy symptoms since we might be able to fit you in for a sick visit. Feel free to contact us anytime with any questions, problems, or concerns.  It was a pleasure to see you again today!  Websites that have reliable patient information: 1. American  Academy of Asthma, Allergy, and Immunology: www.aaaai.org 2. Food Allergy Research and Education (FARE): foodallergy.org 3. Mothers of Asthmatics: http://www.asthmacommunitynetwork.org 4. American College of Allergy, Asthma, and Immunology: www.acaai.org   COVID-19 Vaccine Information can be found at: ShippingScam.co.uk For questions related to vaccine distribution or appointments, please email [email or web address] or call [phone].   We realize that you might be concerned about having an allergic reaction to the COVID19 vaccines. To help with that concern, WE ARE OFFERING THE COVID19 VACCINES IN OUR OFFICE! Ask the front desk for dates!     "Like" [place] on Facebook and Instagram for our latest updates!      A healthy democracy works best when [employer] participate! Make sure you are registered to vote! If you have moved or changed any of your contact information, you will need to get this updated before voting!  In some cases, you MAY be able to register to vote online: [email or web address]

## 2022-05-25 NOTE — Telephone Encounter (Signed)
Butch Penny in radiology at [facility] called regarding the Echo cardiogram Pre cert. I told her it didn't appear that we have called insurance yet since the order was just placed this afternoon by Dr. Ernst Bowler. I told her that once we get this done we will call her back with per cert number at [phone].

## 2022-05-26 ENCOUNTER — Encounter: Payer: Self-pay | Admitting: Allergy & Immunology

## 2022-05-26 NOTE — Telephone Encounter (Signed)
Called patient's insurance - [employer] [phone] - DOB/Member ID verified - per recording, no precertification/prior authorization is required for CPT 93306/Echocardiogram Complete.  [facility] Cardiology Department to [phone] spoke to [facility], PAS  Date: 06/20/22 TIme: 10:30 am Arrival:10:15 am  Restrictions: NONE   Called patient - DOB verified - advised of the above notation.  Patient verbalized understanding, no further questions.

## 2022-06-20 ENCOUNTER — Ambulatory Visit (HOSPITAL_COMMUNITY)
Admission: RE | Admit: 2022-06-20 | Discharge: 2022-06-20 | Disposition: A | Discharge status: Home / Self Care | Payer: 59 | Source: Ambulatory Visit | Attending: Allergy & Immunology | Admitting: Allergy & Immunology

## 2022-06-20 DIAGNOSIS — R0602 Shortness of breath: Secondary | ICD-10-CM | POA: Diagnosis not present

## 2022-06-20 LAB — ECHOCARDIOGRAM COMPLETE
AR max vel: 1.69 cm2
AV Area VTI: 1.9 cm2
AV Area mean vel: 2.05 cm2
AV Mean grad: 3.9 mmHg
AV Peak grad: 8 mmHg
Ao pk vel: 1.41 m/s
Area-P 1/2: 4.15 cm2
S' Lateral: 2.7 cm

## 2022-06-20 NOTE — Progress Notes (Signed)
*  PRELIMINARY RESULTS* Echocardiogram 2D Echocardiogram has been performed.  Deborah Fernandez 06/20/2022, 11:14 AM

## 2022-07-12 ENCOUNTER — Other Ambulatory Visit: Payer: Self-pay | Admitting: Allergy & Immunology

## 2022-07-22 ENCOUNTER — Other Ambulatory Visit (HOSPITAL_COMMUNITY): Payer: Self-pay

## 2022-08-10 ENCOUNTER — Ambulatory Visit: Payer: Medicaid Other | Admitting: Allergy & Immunology

## 2022-08-22 ENCOUNTER — Other Ambulatory Visit: Payer: Self-pay | Admitting: Allergy & Immunology

## 2022-09-19 ENCOUNTER — Other Ambulatory Visit: Payer: Self-pay | Admitting: Allergy & Immunology

## 2022-09-23 ENCOUNTER — Other Ambulatory Visit: Payer: Self-pay | Admitting: Allergy & Immunology

## 2022-11-21 ENCOUNTER — Other Ambulatory Visit: Payer: Self-pay | Admitting: Allergy & Immunology

## 2022-11-23 ENCOUNTER — Other Ambulatory Visit: Payer: Self-pay | Admitting: Allergy & Immunology

## 2022-11-25 ENCOUNTER — Other Ambulatory Visit (HOSPITAL_COMMUNITY): Payer: Self-pay

## 2022-12-23 ENCOUNTER — Ambulatory Visit (INDEPENDENT_AMBULATORY_CARE_PROVIDER_SITE_OTHER): Payer: 59 | Admitting: Family Medicine

## 2022-12-23 ENCOUNTER — Encounter: Payer: Self-pay | Admitting: Family Medicine

## 2022-12-23 VITALS — BP 122/88 | HR 102 | Temp 98.1°F | Resp 16 | Ht 62.6 in | Wt 147.1 lb

## 2022-12-23 DIAGNOSIS — L2084 Intrinsic (allergic) eczema: Secondary | ICD-10-CM | POA: Diagnosis not present

## 2022-12-23 DIAGNOSIS — B999 Unspecified infectious disease: Secondary | ICD-10-CM | POA: Diagnosis not present

## 2022-12-23 DIAGNOSIS — J4540 Moderate persistent asthma, uncomplicated: Secondary | ICD-10-CM | POA: Diagnosis not present

## 2022-12-23 DIAGNOSIS — J302 Other seasonal allergic rhinitis: Secondary | ICD-10-CM

## 2022-12-23 DIAGNOSIS — J3089 Other allergic rhinitis: Secondary | ICD-10-CM

## 2022-12-23 MED ORDER — TRIAMCINOLONE ACETONIDE 0.1 % EX OINT: TOPICAL_OINTMENT | CUTANEOUS | 5 refills | Status: DC

## 2022-12-23 MED ORDER — FLUTICASONE PROPIONATE HFA 110 MCG/ACT IN AERO: 2.0000 | INHALATION_SPRAY | Freq: Two times a day (BID) | RESPIRATORY_TRACT | 5 refills | Status: DC

## 2022-12-23 NOTE — Patient Instructions (Addendum)
Asthma Not well-controlled Begin Flovent 110-2 puffs twice a day with a spacer to prevent cough or wheeze Continue albuterol 2 puffs once every 4 hours as needed for cough or wheeze You may use albuterol 2 puffs 5 to 15 minutes before activity to decrease cough or wheeze  Allergic rhinitis Not well-controlled Continue allergen avoidance measures directed toward grass pollen, weed pollen, ragweed pollen, tree pollen, indoor mold, outdoor mold, dust mite, cat, dog, and cockroach as listed below Continue Xyzal 5 mg once a day as needed for runny nose or itch Consider saline nasal rinses as needed for nasal symptoms. Use this before any medicated nasal sprays for best result Restart allergen immunotherapy if your symptoms are not well-controlled with the treatment plan as listed above. Set up an appointment for your first injection  Atopic dermatitis Not well-controlled Continue a twice a day moisturizing routine Begin triamcinolone 0.1% ointment to red stubborn areas below your face up to twice a day as needed. Do not use this medication longer than 2 weeks in a row.  We will submit you for Dupixent to control your atopic dermatitis. You will hear from our Dupixent coordinator, Tammy, with next steps  Recurrent infection Stable Keep track of infections, antibiotic use, and steroid use  Call the clinic if this treatment plan is not working well for you.  Follow up in 3 months or sooner if needed.  Reducing Pollen Exposure The American Academy of Allergy, Asthma and Immunology suggests the following steps to reduce your exposure to pollen during allergy seasons. Do not hang sheets or clothing out to dry; pollen may collect on these items. Do not mow lawns or spend time around freshly cut grass; mowing stirs up pollen. Keep windows closed at night.  Keep car windows closed while driving. Minimize morning activities outdoors, a time when pollen counts are usually at their highest. Stay  indoors as much as possible when pollen counts or humidity is high and on windy days when pollen tends to remain in the air longer. Use air conditioning when possible.  Many air conditioners have filters that trap the pollen spores. Use a HEPA room air filter to remove pollen form the indoor air you breathe.  Control of Mold Allergen Mold and fungi can grow on a variety of surfaces provided certain temperature and moisture conditions exist.  Outdoor molds grow on plants, decaying vegetation and soil.  The major outdoor mold, Alternaria and Cladosporium, are found in very high numbers during hot and dry conditions.  Generally, a late Summer - Fall peak is seen for common outdoor fungal spores.  Rain will temporarily lower outdoor mold spore count, but counts rise rapidly when the rainy period ends.  The most important indoor molds are Aspergillus and Penicillium.  Dark, humid and poorly ventilated basements are ideal sites for mold growth.  The next most common sites of mold growth are the bathroom and the kitchen.  Outdoor [employer] Use air conditioning and keep windows closed Avoid exposure to decaying vegetation. Avoid leaf raking. Avoid grain handling. Consider wearing a face mask if working in moldy areas.  Indoor Mold Control Maintain humidity below 50%. Clean washable surfaces with 5% bleach solution. Remove sources e.g. Contaminated carpets.   Control of Dust Mite Allergen Dust mites play a major role in allergic asthma and rhinitis. They occur in environments with high humidity wherever human skin is found. Dust mites absorb humidity from the atmosphere (ie, they do not drink) and feed on organic matter (  including shed human and animal skin). Dust mites are a microscopic type of insect that you cannot see with the naked eye. High levels of dust mites have been detected from mattresses, pillows, carpets, upholstered furniture, bed covers, clothes, soft toys and any woven material. The  principal allergen of the dust mite is found in its feces. A gram of dust may contain 1,000 mites and 250,000 fecal particles. Mite antigen is easily measured in the air during house cleaning activities. Dust mites do not bite and do not cause harm to humans, other than by triggering allergies/asthma.  Ways to decrease your exposure to dust mites in your home:  1. Encase mattresses, box springs and pillows with a mite-impermeable barrier or cover  2. Wash sheets, blankets and drapes weekly in hot water (130 F) with detergent and dry them in a dryer on the hot setting.  3. Have the room cleaned frequently with a vacuum cleaner and a damp dust-mop. For carpeting or rugs, vacuuming with a vacuum cleaner equipped with a high-efficiency particulate air (HEPA) filter. The dust mite allergic individual should not be in a room which is being cleaned and should wait 1 hour after cleaning before going into the room.  4. Do not sleep on upholstered furniture (eg, couches).  5. If possible removing carpeting, upholstered furniture and drapery from the home is ideal. Horizontal blinds should be eliminated in the rooms where the person spends the most time (bedroom, study, television room). Washable vinyl, roller-type shades are optimal.  6. Remove all non-washable stuffed toys from the bedroom. Wash stuffed toys weekly like sheets and blankets above.  7. Reduce indoor humidity to less than 50%. Inexpensive humidity monitors can be purchased at most hardware stores. Do not use a humidifier as can make the problem worse and are not recommended.  Control of Dog or Cat Allergen Avoidance is the best way to manage a dog or cat allergy. If you have a dog or cat and are allergic to dog or cats, consider removing the dog or cat from the home. If you have a dog or cat but don't want to find it a new home, or if your family wants a pet even though someone in the household is allergic, here are some strategies that may  help keep symptoms at bay:  Keep the pet out of your bedroom and restrict it to only a few rooms. Be advised that keeping the dog or cat in only one room will not limit the allergens to that room. Don't pet, hug or kiss the dog or cat; if you do, wash your hands with soap and water. High-efficiency particulate air (HEPA) cleaners run continuously in a bedroom or living room can reduce allergen levels over time. Regular use of a high-efficiency vacuum cleaner or a central vacuum can reduce allergen levels. Giving your dog or cat a bath at least once a week can reduce airborne allergen.  Control of Cockroach Allergen Cockroach allergen has been identified as an important cause of acute attacks of asthma, especially in urban settings.  There are fifty-five species of cockroach that exist in the [place], however only three, the [place], [place] species produce allergen that can affect patients with Asthma.  Allergens can be obtained from fecal particles, egg casings and secretions from cockroaches.    Remove food sources. Reduce access to water. Seal access and entry points. Spray runways with 0.5-1% Diazinon or Chlorpyrifos Blow boric acid power under stoves and refrigerator. Place  bait stations (hydramethylnon) at feeding sites.

## 2022-12-23 NOTE — Progress Notes (Unsigned)
[address] [place] [address] Dept: [phone]  FOLLOW UP NOTE  Patient ID: Deborah Fernandez, female    DOB: 2000-09-22  Age: 22 y.o. MRN: [id number] Date of Office Visit: 12/23/2022  Assessment  Chief Complaint: Eczema (Has been having bad eczema flare ups and wants to discuss dupixent )  HPI Deborah Fernandez is a 22 year old female who presents to the clinic for follow-up visit.  She was last seen in this clinic on 05/25/2022 by Dr. Dellis Anes for evaluation of asthma, chest tightness, allergic rhinitis, recurrent infection, and atopic dermatitis.  She has previously completed 2 years of allergen immunotherapy directed toward pollens, mold, dust mite, pets, and cockroach.   Drug Allergies:  Allergies  Allergen Reactions   Clindamycin/Lincomycin Hives   Keflex [Cephalexin] Hives   Propylene Glycol Hives    Per (+)skin testing due to evaluation of  bad break outs on hands/feet- parent was told to avoid per d/w Mom   Other Swelling    almonds   Nickel Swelling    Physical Exam: BP 122/88   Pulse (!) 102   Temp 98.1 F (36.7 C)   Resp 16   Ht 5' 2.6" (1.59 m)   Wt 147 lb 2 oz (66.7 kg)   SpO2 98%   BMI 26.40 kg/m    Physical Exam  Diagnostics: FVC 3.18 which is 89% of predicted value, FEV1 2.65 which is 85% of predicted value.  Spirometry indicates normal ventilatory function.  Assessment and Plan: No diagnosis found.  No orders of the defined types were placed in this encounter.   Patient Instructions  Asthma Begin Flovent 110-2 puffs twice a day with a spacer to prevent cough or wheeze Continue albuterol 2 puffs once every 4 hours as needed for cough or wheeze You may use albuterol 2 puffs 5 to 15 minutes before activity to decrease cough or wheeze  Allergic rhinitis Continue allergen avoidance measures directed toward grass pollen, weed pollen, ragweed pollen, tree pollen, indoor mold, outdoor mold, dust mite, cat, dog, and cockroach as listed  below Continue Xyzal 5 mg once a day as needed for runny nose or itch Consider saline nasal rinses as needed for nasal symptoms. Use this before any medicated nasal sprays for best result Restart allergen immunotherapy if your symptoms are not well-controlled with the treatment plan as listed above. Set up an appointment for your first injection  Atopic dermatitis Continue a twice a day moisturizing routine Begin triamcinolone 0.1% ointment to red stubborn areas below your face up to twice a day as needed. Do not use this medication longer than 2 weeks in a row.  We will submit you for Dupixent to control your atopic dermatitis. You will hear from our Dupixent coordinator, Tammy, with next steps  Recurrent infection Keep track of infections, antibiotic use, and steroid use  Call the clinic if this treatment plan is not working well for you.  Follow up in 3 months or sooner if needed.  Reducing Pollen Exposure The American Academy of Allergy, Asthma and Immunology suggests the following steps to reduce your exposure to pollen during allergy seasons. Do not hang sheets or clothing out to dry; pollen may collect on these items. Do not mow lawns or spend time around freshly cut grass; mowing stirs up pollen. Keep windows closed at night.  Keep car windows closed while driving. Minimize morning activities outdoors, a time when pollen counts are usually at their highest. Stay indoors as much as possible when pollen  counts or humidity is high and on windy days when pollen tends to remain in the air longer. Use air conditioning when possible.  Many air conditioners have filters that trap the pollen spores. Use a HEPA room air filter to remove pollen form the indoor air you breathe.  Control of Mold Allergen Mold and fungi can grow on a variety of surfaces provided certain temperature and moisture conditions exist.  Outdoor molds grow on plants, decaying vegetation and soil.  The major outdoor  mold, Alternaria and Cladosporium, are found in very high numbers during hot and dry conditions.  Generally, a late Summer - Fall peak is seen for common outdoor fungal spores.  Rain will temporarily lower outdoor mold spore count, but counts rise rapidly when the rainy period ends.  The most important indoor molds are Aspergillus and Penicillium.  Dark, humid and poorly ventilated basements are ideal sites for mold growth.  The next most common sites of mold growth are the bathroom and the kitchen.  Outdoor [employer] Use air conditioning and keep windows closed Avoid exposure to decaying vegetation. Avoid leaf raking. Avoid grain handling. Consider wearing a face mask if working in moldy areas.  Indoor Mold Control Maintain humidity below 50%. Clean washable surfaces with 5% bleach solution. Remove sources e.g. Contaminated carpets.   Control of Dust Mite Allergen Dust mites play a major role in allergic asthma and rhinitis. They occur in environments with high humidity wherever human skin is found. Dust mites absorb humidity from the atmosphere (ie, they do not drink) and feed on organic matter (including shed human and animal skin). Dust mites are a microscopic type of insect that you cannot see with the naked eye. High levels of dust mites have been detected from mattresses, pillows, carpets, upholstered furniture, bed covers, clothes, soft toys and any woven material. The principal allergen of the dust mite is found in its feces. A gram of dust may contain 1,000 mites and 250,000 fecal particles. Mite antigen is easily measured in the air during house cleaning activities. Dust mites do not bite and do not cause harm to humans, other than by triggering allergies/asthma.  Ways to decrease your exposure to dust mites in your home:  1. Encase mattresses, box springs and pillows with a mite-impermeable barrier or cover  2. Wash sheets, blankets and drapes weekly in hot water (130 F) with  detergent and dry them in a dryer on the hot setting.  3. Have the room cleaned frequently with a vacuum cleaner and a damp dust-mop. For carpeting or rugs, vacuuming with a vacuum cleaner equipped with a high-efficiency particulate air (HEPA) filter. The dust mite allergic individual should not be in a room which is being cleaned and should wait 1 hour after cleaning before going into the room.  4. Do not sleep on upholstered furniture (eg, couches).  5. If possible removing carpeting, upholstered furniture and drapery from the home is ideal. Horizontal blinds should be eliminated in the rooms where the person spends the most time (bedroom, study, television room). Washable vinyl, roller-type shades are optimal.  6. Remove all non-washable stuffed toys from the bedroom. Wash stuffed toys weekly like sheets and blankets above.  7. Reduce indoor humidity to less than 50%. Inexpensive humidity monitors can be purchased at most hardware stores. Do not use a humidifier as can make the problem worse and are not recommended.  Control of Dog or Cat Allergen Avoidance is the best way to manage a dog or  cat allergy. If you have a dog or cat and are allergic to dog or cats, consider removing the dog or cat from the home. If you have a dog or cat but don't want to find it a new home, or if your family wants a pet even though someone in the household is allergic, here are some strategies that may help keep symptoms at bay:  Keep the pet out of your bedroom and restrict it to only a few rooms. Be advised that keeping the dog or cat in only one room will not limit the allergens to that room. Don't pet, hug or kiss the dog or cat; if you do, wash your hands with soap and water. High-efficiency particulate air (HEPA) cleaners run continuously in a bedroom or living room can reduce allergen levels over time. Regular use of a high-efficiency vacuum cleaner or a central vacuum can reduce allergen levels. Giving  your dog or cat a bath at least once a week can reduce airborne allergen.  Control of Cockroach Allergen Cockroach allergen has been identified as an important cause of acute attacks of asthma, especially in urban settings.  There are fifty-five species of cockroach that exist in the [place], however only three, the [place], [place] species produce allergen that can affect patients with Asthma.  Allergens can be obtained from fecal particles, egg casings and secretions from cockroaches.    Remove food sources. Reduce access to water. Seal access and entry points. Spray runways with 0.5-1% Diazinon or Chlorpyrifos Blow boric acid power under stoves and refrigerator. Place bait stations (hydramethylnon) at feeding sites.    No follow-ups on file.    Thank you for the opportunity to care for this patient.  Please do not hesitate to contact me with questions.  Thermon Leyland, FNP [facility]

## 2022-12-25 ENCOUNTER — Encounter: Payer: Self-pay | Admitting: Family Medicine

## 2022-12-25 DIAGNOSIS — J4540 Moderate persistent asthma, uncomplicated: Secondary | ICD-10-CM | POA: Insufficient documentation

## 2022-12-27 ENCOUNTER — Other Ambulatory Visit: Payer: Self-pay | Admitting: *Deleted

## 2022-12-27 ENCOUNTER — Encounter: Payer: Self-pay | Admitting: Allergy & Immunology

## 2022-12-27 ENCOUNTER — Telehealth: Payer: Self-pay

## 2022-12-27 ENCOUNTER — Other Ambulatory Visit (HOSPITAL_COMMUNITY): Payer: Self-pay

## 2022-12-27 ENCOUNTER — Telehealth: Payer: Self-pay | Admitting: Allergy & Immunology

## 2022-12-27 MED ORDER — QVAR REDIHALER 80 MCG/ACT IN AERB: 2.0000 | INHALATION_SPRAY | Freq: Two times a day (BID) | RESPIRATORY_TRACT | 5 refills | Status: DC

## 2022-12-27 NOTE — Telephone Encounter (Signed)
Patient called and I passed along the message about her inhaler. We also discussed allergy shots, including rush immunotherapy. She was very interested in this. I sent her information on the CPT codes.   Malachi Bonds, MD [facility]

## 2022-12-27 NOTE — Telephone Encounter (Signed)
Insurance states that Arnuity or QVAR is preferred over Flovent, do you want to change inhaler?

## 2022-12-27 NOTE — Telephone Encounter (Signed)
Qvar 80-2 puffs twice a day please. Do not use with a spacer. Thank you

## 2022-12-27 NOTE — Telephone Encounter (Signed)
Patient would like a call back regarding conversation you had with her @ [phone].

## 2022-12-27 NOTE — Telephone Encounter (Signed)
*  Asthma/Allergy  Pharmacy Patient Advocate Encounter   Received notification from CoverMyMeds that prior authorization for Flovent HFA 110MCG/ACT aerosol  is required/requested.   Insurance verification completed.   The patient is insured through [facility] .   Per test claim:  Arnuity or Qvar is preferred by the insurance.  If suggested medication is appropriate, Please send in a new RX and discontinue this one. If not, please advise as to why it's not appropriate so that we may request a Prior Authorization.

## 2022-12-27 NOTE — Telephone Encounter (Signed)
New prescription has been sent in. Called patient and left a voicemail asking for a return call to inform.

## 2022-12-30 ENCOUNTER — Telehealth: Payer: Self-pay | Admitting: *Deleted

## 2022-12-30 NOTE — Telephone Encounter (Signed)
Spoke to patient and advised submit with reminder of delivery, storage and dosing instructions

## 2022-12-30 NOTE — Telephone Encounter (Signed)
Thank you :)

## 2022-12-30 NOTE — Telephone Encounter (Signed)
-----   Message from Thermon Leyland sent at 12/25/2022  7:06 PM EDT ----- Hi Juliet Vasbinder, Can you please submit this patient for Dupixent for AD? Thank you

## 2022-12-30 NOTE — Telephone Encounter (Signed)
L/m for patient to contact me to advise approval, copay card and submit to [employer] for Dupixent restart

## 2023-01-04 NOTE — Telephone Encounter (Signed)
I called patient back to discuss rush immunotherapy. She called her insurance and they cover it. So she would like to go ahead and schedule.  Malachi Bonds, MD [facility]

## 2023-01-05 NOTE — Telephone Encounter (Signed)
Excellent.  Thank you

## 2023-01-27 ENCOUNTER — Other Ambulatory Visit: Payer: Self-pay | Admitting: Allergy & Immunology

## 2023-03-24 ENCOUNTER — Ambulatory Visit: Payer: Medicaid Other | Admitting: Family Medicine

## 2023-03-24 NOTE — Progress Notes (Unsigned)
   [address] [place] [address] Dept: [phone]  FOLLOW UP NOTE  Patient ID: Deborah Fernandez, female    DOB: Apr 12, 2001  Age: 22 y.o. MRN: [id number] Date of Office Visit: 03/24/2023  Assessment  Chief Complaint: No chief complaint on file.  HPI Deborah Fernandez is a 22 year old female who presents to the clinic for follow-up visit.  She she was last seen in this clinic on 12/23/2022 by Thermon Leyland, FNP, for evaluation of asthma, allergic rhinitis, atopic dermatitis, and recurrent infection.  Her last environmental allergy skin testing was on 09/13/2018 was positive to grass pollen, weed pollen, ragweed pollen, tree pollen, mold, dust mite, cat, dog, and cockroach  Discussed the use of AI scribe software for clinical note transcription with the patient, who gave verbal consent to proceed.  History of Present Illness             Drug Allergies:  Allergies  Allergen Reactions   Clindamycin/Lincomycin Hives   Keflex [Cephalexin] Hives   Propylene Glycol Hives    Per (+)skin testing due to evaluation of  bad break outs on hands/feet- parent was told to avoid per d/w Mom   Other Swelling    almonds   Nickel Swelling    Physical Exam: There were no vitals taken for this visit.   Physical Exam  Diagnostics:    Assessment and Plan: No diagnosis found.  No orders of the defined types were placed in this encounter.   There are no Patient Instructions on file for this visit.  No follow-ups on file.    Thank you for the opportunity to care for this patient.  Please do not hesitate to contact me with questions.  Thermon Leyland, FNP [facility]

## 2023-03-24 NOTE — Patient Instructions (Incomplete)
Asthma Not well-controlled Begin Flovent 110-2 puffs twice a day with a spacer to prevent cough or wheeze Continue albuterol 2 puffs once every 4 hours as needed for cough or wheeze You may use albuterol 2 puffs 5 to 15 minutes before activity to decrease cough or wheeze  Allergic rhinitis Not well-controlled Continue allergen avoidance measures directed toward grass pollen, weed pollen, ragweed pollen, tree pollen, indoor mold, outdoor mold, dust mite, cat, dog, and cockroach as listed below Continue Xyzal 5 mg once a day as needed for runny nose or itch Consider saline nasal rinses as needed for nasal symptoms. Use this before any medicated nasal sprays for best result Restart allergen immunotherapy if your symptoms are not well-controlled with the treatment plan as listed above. Set up an appointment for your first injection  Atopic dermatitis Not well-controlled Continue a twice a day moisturizing routine Begin triamcinolone 0.1% ointment to red stubborn areas below your face up to twice a day as needed. Do not use this medication longer than 2 weeks in a row.  We will submit you for Dupixent to control your atopic dermatitis. You will hear from our Dupixent coordinator, Tammy, with next steps  Recurrent infection Stable Keep track of infections, antibiotic use, and steroid use  Call the clinic if this treatment plan is not working well for you.  Follow up in 3 months or sooner if needed.  Reducing Pollen Exposure The American Academy of Allergy, Asthma and Immunology suggests the following steps to reduce your exposure to pollen during allergy seasons. Do not hang sheets or clothing out to dry; pollen may collect on these items. Do not mow lawns or spend time around freshly cut grass; mowing stirs up pollen. Keep windows closed at night.  Keep car windows closed while driving. Minimize morning activities outdoors, a time when pollen counts are usually at their highest. Stay  indoors as much as possible when pollen counts or humidity is high and on windy days when pollen tends to remain in the air longer. Use air conditioning when possible.  Many air conditioners have filters that trap the pollen spores. Use a HEPA room air filter to remove pollen form the indoor air you breathe.  Control of Mold Allergen Mold and fungi can grow on a variety of surfaces provided certain temperature and moisture conditions exist.  Outdoor molds grow on plants, decaying vegetation and soil.  The major outdoor mold, Alternaria and Cladosporium, are found in very high numbers during hot and dry conditions.  Generally, a late Summer - Fall peak is seen for common outdoor fungal spores.  Rain will temporarily lower outdoor mold spore count, but counts rise rapidly when the rainy period ends.  The most important indoor molds are Aspergillus and Penicillium.  Dark, humid and poorly ventilated basements are ideal sites for mold growth.  The next most common sites of mold growth are the bathroom and the kitchen.  Outdoor [employer] Use air conditioning and keep windows closed Avoid exposure to decaying vegetation. Avoid leaf raking. Avoid grain handling. Consider wearing a face mask if working in moldy areas.  Indoor Mold Control Maintain humidity below 50%. Clean washable surfaces with 5% bleach solution. Remove sources e.g. Contaminated carpets.   Control of Dust Mite Allergen Dust mites play a major role in allergic asthma and rhinitis. They occur in environments with high humidity wherever human skin is found. Dust mites absorb humidity from the atmosphere (ie, they do not drink) and feed on organic matter (  including shed human and animal skin). Dust mites are a microscopic type of insect that you cannot see with the naked eye. High levels of dust mites have been detected from mattresses, pillows, carpets, upholstered furniture, bed covers, clothes, soft toys and any woven material. The  principal allergen of the dust mite is found in its feces. A gram of dust may contain 1,000 mites and 250,000 fecal particles. Mite antigen is easily measured in the air during house cleaning activities. Dust mites do not bite and do not cause harm to humans, other than by triggering allergies/asthma.  Ways to decrease your exposure to dust mites in your home:  1. Encase mattresses, box springs and pillows with a mite-impermeable barrier or cover  2. Wash sheets, blankets and drapes weekly in hot water (130 F) with detergent and dry them in a dryer on the hot setting.  3. Have the room cleaned frequently with a vacuum cleaner and a damp dust-mop. For carpeting or rugs, vacuuming with a vacuum cleaner equipped with a high-efficiency particulate air (HEPA) filter. The dust mite allergic individual should not be in a room which is being cleaned and should wait 1 hour after cleaning before going into the room.  4. Do not sleep on upholstered furniture (eg, couches).  5. If possible removing carpeting, upholstered furniture and drapery from the home is ideal. Horizontal blinds should be eliminated in the rooms where the person spends the most time (bedroom, study, television room). Washable vinyl, roller-type shades are optimal.  6. Remove all non-washable stuffed toys from the bedroom. Wash stuffed toys weekly like sheets and blankets above.  7. Reduce indoor humidity to less than 50%. Inexpensive humidity monitors can be purchased at most hardware stores. Do not use a humidifier as can make the problem worse and are not recommended.  Control of Dog or Cat Allergen Avoidance is the best way to manage a dog or cat allergy. If you have a dog or cat and are allergic to dog or cats, consider removing the dog or cat from the home. If you have a dog or cat but don't want to find it a new home, or if your family wants a pet even though someone in the household is allergic, here are some strategies that may  help keep symptoms at bay:  Keep the pet out of your bedroom and restrict it to only a few rooms. Be advised that keeping the dog or cat in only one room will not limit the allergens to that room. Don't pet, hug or kiss the dog or cat; if you do, wash your hands with soap and water. High-efficiency particulate air (HEPA) cleaners run continuously in a bedroom or living room can reduce allergen levels over time. Regular use of a high-efficiency vacuum cleaner or a central vacuum can reduce allergen levels. Giving your dog or cat a bath at least once a week can reduce airborne allergen.  Control of Cockroach Allergen Cockroach allergen has been identified as an important cause of acute attacks of asthma, especially in urban settings.  There are fifty-five species of cockroach that exist in the [place], however only three, the [place], [place] species produce allergen that can affect patients with Asthma.  Allergens can be obtained from fecal particles, egg casings and secretions from cockroaches.    Remove food sources. Reduce access to water. Seal access and entry points. Spray runways with 0.5-1% Diazinon or Chlorpyrifos Blow boric acid power under stoves and refrigerator. Place  bait stations (hydramethylnon) at feeding sites.

## 2023-04-13 ENCOUNTER — Other Ambulatory Visit: Payer: Self-pay | Admitting: Allergy & Immunology

## 2023-04-14 ENCOUNTER — Other Ambulatory Visit: Payer: Self-pay | Admitting: Allergy & Immunology

## 2023-04-26 ENCOUNTER — Other Ambulatory Visit (HOSPITAL_COMMUNITY): Payer: Self-pay

## 2023-04-27 ENCOUNTER — Other Ambulatory Visit: Payer: Self-pay | Admitting: Allergy & Immunology

## 2023-05-05 ENCOUNTER — Other Ambulatory Visit: Payer: Self-pay

## 2023-05-05 ENCOUNTER — Ambulatory Visit (INDEPENDENT_AMBULATORY_CARE_PROVIDER_SITE_OTHER): Payer: 59 | Admitting: Allergy & Immunology

## 2023-05-05 VITALS — BP 120/76 | HR 130 | Temp 98.3°F | Resp 18 | Ht 62.21 in | Wt 130.8 lb

## 2023-05-05 DIAGNOSIS — J3089 Other allergic rhinitis: Secondary | ICD-10-CM

## 2023-05-05 DIAGNOSIS — J4530 Mild persistent asthma, uncomplicated: Secondary | ICD-10-CM

## 2023-05-05 DIAGNOSIS — J302 Other seasonal allergic rhinitis: Secondary | ICD-10-CM

## 2023-05-05 DIAGNOSIS — L2084 Intrinsic (allergic) eczema: Secondary | ICD-10-CM | POA: Diagnosis not present

## 2023-05-05 DIAGNOSIS — B999 Unspecified infectious disease: Secondary | ICD-10-CM

## 2023-05-05 NOTE — Progress Notes (Unsigned)
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 However, inconsistent use of inhaler and nebulizer noted. Spirometry results improved compared to previous visit. -Continue Dupixent as tolerated. -Encourage consistent use of inhaler and nebulizer as needed. -Check inhaler and nebulizer refills.  Recurrent MRSA infections Patient reports two recent MRSA infections, including one in the eye. Treated with two different antibiotics and an antibiotic ointment. -Continue to monitor for signs of infection. -Consider referral to Infectious Disease if infections persist or worsen.  General Health Maintenance -Ensure patient's infant daughter is up-to-date on vaccinations. Patient reports daughter has received 37-month shots but not 54-month shots. Patient to follow up with pediatrician.         Patient Instructions  1. Seasonal and perennial allergic rhinitis (grasses, ragweed, weeds, trees, indoor molds, outdoor molds, dust mites, cat, dog and cockroach) - You are doing so good!  - We will talk about starting allergy shots again once you give birth.  - Continue with: Xyzal (levocetirizine) 5mg  tablet once daily  2. Mild persistent asthma, uncomplicated - Lung testing looks great today. - I am fine without a controller medication.  - Daily controller medication(s): Dupixent every two weeks - Prior to physical  activity: albuterol 2 puffs 10-15 minutes before physical activity. - Rescue medications: albuterol 4 puffs every 4-6 hours as needed or albuterol nebulizer one vial every 4-6 hours as needed - Asthma control goals:  * Full participation in all desired activities (may need albuterol before activity) * Albuterol use two time or less a week on average (not counting use with activity) * Cough interfering with sleep two time or less a month * Oral steroids no more than once a year * No hospitalizations  3. Eczema with contact dermatitis  - Continue with the Dupixent every.  - Continue with the topical treatments as needed.   4. Recurrent infections - These seem to be under good control.  5. Return in about 6 months (around 11/02/2023). You can have the follow up appointment with Dr. Dellis Anes or a Nurse Practicioner (our Nurse Practitioners are excellent and always have Physician oversight!).    Please inform us of any Emergency Department visits, hospitalizations, or changes in symptoms. Call us before going to the ED for breathing or allergy symptoms since we might be able to fit you in for a sick visit. Feel free to contact us anytime with any questions, problems, or concerns.  It was a pleasure to see you and your family again today! Deborah Fernandez is adorable!!  Websites that have reliable patient information: 1. American Academy of Asthma, Allergy, and Immunology: www.aaaai.org 2. Food Allergy Research and Education (FARE): foodallergy.org 3. Mothers of Asthmatics: http://www.asthmacommunitynetwork.org 4. American College of Allergy, Asthma, and Immunology: www.acaai.org      "Like" [place] on Facebook and Instagram for our latest updates!      A healthy democracy works best when [employer] participate! Make sure you are registered to vote! If you have moved or changed  any of your contact information, you will need to get this updated before voting! Scan the QR codes below to learn more!              Subjective:   Deborah Fernandez is a 23 y.o. female presenting today for follow up of  Chief Complaint  Patient presents with  . Asthma    No issues - has improves   . Rash    Non-raised rash on her ankles and has been there for months   . Eczema    No too back - should be able to restart dupixent soon     [name] has a history of the following: Patient Active Problem List   Diagnosis Date Noted  . Not well controlled moderate persistent asthma 12/25/2022  . Rectal bleeding 07/10/2020  . Dysphagia 07/10/2020  . Loose stools 07/10/2020  . Mild intermittent asthma with acute exacerbation 05/29/2020  . Seasonal and perennial allergic rhinitis 05/29/2020  . Flexural atopic dermatitis 05/29/2020  . On total parenteral nutrition   . Liver abscess   . Spleen, abscess   . Abdominal pain   . Recurrent infections   . Dehydration   . Diarrhea   . PICC (peripherally inserted central catheter) in place   . Weight loss 12/15/2015  . Fever 12/15/2015  . Nausea with vomiting   . Pyrexia 12/14/2015  . Attention deficit hyperactivity disorder (ADHD) 07/07/2015  . Major depression 04/08/2015  . Intrinsic atopic dermatitis 01/30/2015  . 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 Depression with suicidal ideation 01/24/2015    History obtained from: chart review and {Persons; PED relatives w/patient:19415::"patient"}.  Discussed the use of AI scribe software for clinical note transcription with the patient and/or guardian, who gave verbal consent to proceed.  Deborah Fernandez is a 23 y.o. female presenting for {Blank single:19197::"a food challenge","a drug challenge","skin testing","a sick visit","an evaluation of ***","a follow up visit"}.  She was last seen in September 2024.  At that time, she continue with Xyzal as well as nasal saline rinses.  For her atopic dermatitis, she continued with triamcinolone and talked about starting Dupixent again.  Infections were stable.   She had not had any recent ones.  Since last visit,  Discussed the use of AI scribe software for clinical note transcription with the patient, who gave verbal consent to proceed.  History of Present Illness   The patient, a young adult with a history of asthma and multiple MRSA infections, presents for a follow-up visit. She reports a recent MRSA infection in the eye over Thanksgiving, which was treated with two different antibiotics and an antibiotic ointment. The patient notes that the infection has since resolved.  The patient also reports occasional allergy symptoms, including a runny nose and puffy eyes, but states she has been avoiding known allergens. She is currently on Dupixent, which she self-administers, and reports some improvement in her breathing. However, she expresses dissatisfaction with the medication due to the cost and the process of renewing her care credit.  The patient also mentions she has an inhaler for emergencies and a nebulizer at home, which she uses as needed. She admits to sometimes reaching for the nebulizer out of habit from when her breathing was significantly worse.  The patient is also a new parent to a seven-month-old baby, who is currently teething. She reports that the baby is not yet sleeping through the night and is constantly eating. The baby has had her four-month shots but not her six-month  shots due to issues with the doctor's office switching systems. The patient is considering switching pediatricians due to dissatisfaction with the current office's system.  The patient is not currently working and plans to return to school for botany. She has no new medications and is unsure if she needs any refills.        {Blank single:19197::"Asthma/Respiratory Symptom History: ***"," "}  {Blank single:19197::"Allergic Rhinitis Symptom History: ***"," "}  {Blank single:19197::"Food Allergy Symptom History: ***"," "}  {Blank single:19197::"Skin Symptom  History: ***"," "}  {Blank single:19197::"GERD Symptom History: ***"," "}  Otherwise, there have been no changes to her past medical history, surgical history, family history, or social history.    Review of systems otherwise negative other than that mentioned in the HPI.    Objective:   Blood pressure 120/76, pulse (!) 130, temperature 98.3 F (36.8 C), temperature source Temporal, resp. rate 18, height 5' 2.21" (1.58 m), weight 130 lb 12.8 oz (59.3 kg), SpO2 95%. Body mass index is 23.77 kg/m.    Physical Exam   Diagnostic studies:    Spirometry: results normal (FEV1: 2.73/87%, FVC: 3.27/92%, FEV1/FVC: 83%).    Spirometry consistent with normal pattern. {Blank single:19197::"Albuterol/Atrovent nebulizer","Xopenex/Atrovent nebulizer","Albuterol nebulizer","Albuterol four puffs via MDI","Xopenex four puffs via MDI"} treatment given in clinic with {Blank single:19197::"significant improvement in FEV1 per ATS criteria","significant improvement in FVC per ATS criteria","significant improvement in FEV1 and FVC per ATS criteria","improvement in FEV1, but not significant per ATS criteria","improvement in FVC, but not significant per ATS criteria","improvement in FEV1 and FVC, but not significant per ATS criteria","no improvement"}.  Allergy Studies: {Blank single:19197::"none","deferred due to recent antihistamine use","deferred due to insurance stipulations that require a separate visit for testing","labs sent instead"," "}    {Blank single:19197::"Allergy testing results were read and interpreted by myself, documented by clinical staff."," "}      Malachi Bonds, MD  [facility]

## 2023-05-05 NOTE — Patient Instructions (Addendum)
1. Seasonal and perennial allergic rhinitis (grasses, ragweed, weeds, trees, indoor molds, outdoor molds, dust mites, cat, dog and cockroach) - You are doing so good!  - We will talk about starting allergy shots again once you give birth.  - Continue with: Xyzal (levocetirizine) 5mg  tablet once daily  2. Mild persistent asthma, uncomplicated - Lung testing looks great today. - I am fine without a controller medication.  - Daily controller medication(s): Dupixent every two weeks - Prior to physical activity: albuterol 2 puffs 10-15 minutes before physical activity. - Rescue medications: albuterol 4 puffs every 4-6 hours as needed or albuterol nebulizer one vial every 4-6 hours as needed - Asthma control goals:  * Full participation in all desired activities (may need albuterol before activity) * Albuterol use two time or less a week on average (not counting use with activity) * Cough interfering with sleep two time or less a month * Oral steroids no more than once a year * No hospitalizations  3. Eczema with contact dermatitis  - Continue with the Dupixent every.  - Continue with the topical treatments as needed.   4. Recurrent infections - These seem to be under good control.  5. Return in about 6 months (around 11/02/2023). You can have the follow up appointment with Dr. Dellis Anes or a Nurse Practicioner (our Nurse Practitioners are excellent and always have Physician oversight!).    Please inform us of any Emergency Department visits, hospitalizations, or changes in symptoms. Call us before going to the ED for breathing or allergy symptoms since we might be able to fit you in for a sick visit. Feel free to contact us anytime with any questions, problems, or concerns.  It was a pleasure to see you and your family again today! [name] is adorable!!  Websites that have reliable patient information: 1. American Academy of Asthma, Allergy, and Immunology: www.aaaai.org 2. Food Allergy  Research and Education (FARE): foodallergy.org 3. Mothers of Asthmatics: http://www.asthmacommunitynetwork.org 4. American College of Allergy, Asthma, and Immunology: www.acaai.org      "Like" [place] on Facebook and Instagram for our latest updates!      A healthy democracy works best when [employer] participate! Make sure you are registered to vote! If you have moved or changed any of your contact information, you will need to get this updated before voting! Scan the QR codes below to learn more!

## 2023-05-09 ENCOUNTER — Encounter: Payer: Self-pay | Admitting: Allergy & Immunology

## 2023-06-21 ENCOUNTER — Other Ambulatory Visit: Payer: Self-pay | Admitting: Allergy & Immunology

## 2023-07-24 ENCOUNTER — Other Ambulatory Visit: Payer: Self-pay | Admitting: Allergy & Immunology

## 2023-08-14 ENCOUNTER — Other Ambulatory Visit: Payer: Self-pay | Admitting: Allergy & Immunology

## 2023-08-19 ENCOUNTER — Other Ambulatory Visit: Payer: Self-pay

## 2023-08-19 ENCOUNTER — Emergency Department (HOSPITAL_COMMUNITY)
Admission: EM | Admit: 2023-08-19 | Discharge: 2023-08-19 | Disposition: A | Discharge status: Home / Self Care | Attending: Emergency Medicine | Admitting: Emergency Medicine

## 2023-08-19 ENCOUNTER — Emergency Department (HOSPITAL_COMMUNITY)

## 2023-08-19 ENCOUNTER — Encounter (HOSPITAL_COMMUNITY): Payer: Self-pay | Admitting: Emergency Medicine

## 2023-08-19 DIAGNOSIS — R000 Tachycardia, unspecified: Secondary | ICD-10-CM | POA: Diagnosis not present

## 2023-08-19 DIAGNOSIS — J029 Acute pharyngitis, unspecified: Secondary | ICD-10-CM | POA: Diagnosis not present

## 2023-08-19 DIAGNOSIS — D72829 Elevated white blood cell count, unspecified: Secondary | ICD-10-CM | POA: Insufficient documentation

## 2023-08-19 DIAGNOSIS — R509 Fever, unspecified: Secondary | ICD-10-CM | POA: Diagnosis present

## 2023-08-19 LAB — RESP PANEL BY RT-PCR (RSV, FLU A&B, COVID)  RVPGX2
Influenza A by PCR: NEGATIVE
Influenza B by PCR: NEGATIVE
Resp Syncytial Virus by PCR: NEGATIVE
SARS Coronavirus 2 by RT PCR: NEGATIVE

## 2023-08-19 LAB — BASIC METABOLIC PANEL WITH GFR
Anion gap: 15 (ref 5–15)
BUN: 8 mg/dL (ref 6–20)
CO2: 14 mmol/L — ABNORMAL LOW (ref 22–32)
Calcium: 8.7 mg/dL — ABNORMAL LOW (ref 8.9–10.3)
Chloride: 106 mmol/L (ref 98–111)
Creatinine, Ser: 0.86 mg/dL (ref 0.44–1.00)
GFR, Estimated: >60 mL/min (ref >60)
Glucose, Bld: 81 mg/dL (ref 70–99)
Potassium: 3.7 mmol/L (ref 3.5–5.1)
Sodium: 135 mmol/L (ref 135–145)

## 2023-08-19 LAB — CBC
HCT: 42.5 % (ref 36.0–46.0)
Hemoglobin: 13.9 g/dL (ref 12.0–15.0)
MCH: 29.5 pg (ref 26.0–34.0)
MCHC: 32.7 g/dL (ref 30.0–36.0)
MCV: 90.2 fL (ref 80.0–100.0)
Platelets: 293 10*3/uL (ref 150–400)
RBC: 4.71 MIL/uL (ref 3.87–5.11)
RDW: 13.1 % (ref 11.5–15.5)
WBC: 12.3 10*3/uL — ABNORMAL HIGH (ref 4.0–10.5)
nRBC: 0 % (ref 0.0–0.2)

## 2023-08-19 LAB — GROUP A STREP BY PCR: Group A Strep by PCR: NOT DETECTED

## 2023-08-19 LAB — PREGNANCY, URINE: Preg Test, Ur: NEGATIVE

## 2023-08-19 LAB — I-STAT CG4 LACTIC ACID, ED: Lactic Acid, Venous: 0.7 mmol/L (ref 0.5–1.9)

## 2023-08-19 LAB — MONONUCLEOSIS SCREEN: Mono Screen: NEGATIVE

## 2023-08-19 MED ORDER — LACTATED RINGERS IV BOLUS
1000.0000 mL | Freq: Once | INTRAVENOUS | Status: AC
  Administered 2023-08-19: 1000 mL via INTRAVENOUS

## 2023-08-19 MED ORDER — AMOXICILLIN-POT CLAVULANATE 875-125 MG PO TABS: 1.0000 | ORAL_TABLET | Freq: Two times a day (BID) | ORAL | 0 refills | Status: AC

## 2023-08-19 MED ORDER — DEXAMETHASONE SODIUM PHOSPHATE 10 MG/ML IJ SOLN
INTRAMUSCULAR | Status: AC
  Filled 2023-08-19: qty 1

## 2023-08-19 MED ORDER — DEXAMETHASONE SODIUM PHOSPHATE 10 MG/ML IJ SOLN: 10.0000 mg | Freq: Once | INTRAMUSCULAR | Status: DC

## 2023-08-19 MED ORDER — IOHEXOL 350 MG/ML SOLN
75.0000 mL | Freq: Once | INTRAVENOUS | Status: AC | PRN
Start: 2023-08-19 — End: 2023-08-19
  Administered 2023-08-19: 75 mL via INTRAVENOUS

## 2023-08-19 MED ORDER — DEXAMETHASONE SODIUM PHOSPHATE 10 MG/ML IJ SOLN
10.0000 mg | Freq: Once | INTRAMUSCULAR | Status: AC
  Administered 2023-08-19: 10 mg via INTRAVENOUS
  Filled 2023-08-19: qty 1

## 2023-08-19 MED ORDER — MORPHINE SULFATE (PF) 2 MG/ML IV SOLN
2.0000 mg | Freq: Once | INTRAVENOUS | Status: AC
  Administered 2023-08-19: 2 mg via INTRAVENOUS
  Filled 2023-08-19: qty 1

## 2023-08-19 NOTE — ED Provider Notes (Signed)
 [facility] AT [facility] Provider Note   CSN: [id number] Arrival date & time: 08/19/23  1215     History  Chief Complaint  Patient presents with   Fever   Sore Throat    Deborah Fernandez is a 23 y.o. female.  HPI 23 year old female presents today complaining of sore throat.  Symptoms began on Tuesday.  She was seen by her primary care doctor on Wednesday and reports that strep screen was negative.  Patient presents today due to worsening sore throat.  She has had difficulty taking p.o. and speaking.  She reports that she is not pregnant.  She denies a significant past medical history.     Home Medications Prior to Admission medications   Medication Sig Start Date End Date Taking? Authorizing Provider  albuterol  (PROVENTIL ) (2.5 MG/3ML) 0.083% nebulizer solution take 3 mls (2.5mg  total) by nebulization every 6 hours as needed. 04/17/23   Rochester Chuck, MD  albuterol  (PROVENTIL ) (2.5 MG/3ML) 0.083% nebulizer solution take 3 mls (2.5mg  total) by nebulization every 6 hours as needed. 04/17/23   Rochester Chuck, MD  albuterol  (PROVENTIL ) (2.5 MG/3ML) 0.083% nebulizer solution USE 1 VIAL IN NEBULIZER EVERY 6 HOURS AS NEEDED 08/14/23   Rochester Chuck, MD  albuterol  (VENTOLIN  HFA) 108 (90 Base) MCG/ACT inhaler INHALE 2 PUFFS BY MOUTH EVERY 4 HOURS AS NEEDED FOR WHEEZING AND FOR SHORTNESS OF BREATH 07/24/23   Rochester Chuck, MD  amoxicillin-clavulanate (AUGMENTIN) 500-125 MG tablet  12/12/22   [provider]  DUPIXENT  300 MG/2ML prefilled syringe  02/23/23   [provider]  FLUoxetine  (PROZAC ) 10 MG capsule Take 1 capsule by mouth daily. 11/07/22 11/07/23  [provider]  fluticasone  (FLOVENT  HFA) 110 MCG/ACT inhaler Inhale 2 puffs into the lungs 2 (two) times daily. 12/23/22   Ardie Kras, FNP  ibuprofen  (ADVIL ) 800 MG tablet Take by mouth. 10/17/22   [provider]  ipratropium-albuterol  (DUONEB) 0.5-2.5  (3) MG/3ML SOLN Inhale 3 mLs into the lungs every 4 (four) hours as needed (For cough and wheeze.). 03/15/22   [provider]  levocetirizine (XYZAL ) 5 MG tablet Take 1 tablet (5 mg total) by mouth daily as needed (Can take an extra dose during flare ups.). 05/25/22   Rochester Chuck, MD  nitrofurantoin, macrocrystal-monohydrate, (MACROBID) 100 MG capsule Take 100 mg by mouth as needed. 05/03/21   [provider]  QVAR  REDIHALER 80 MCG/ACT inhaler Inhale 2 puffs into the lungs 2 (two) times daily. 12/27/22   Ardie Kras, FNP  Respiratory Therapy Supplies (NEBULIZER MASK ADULT) MISC 1 Device by Does not apply route as needed. 05/25/22   Rochester Chuck, MD  Spacer/Aero-Holding Idelle Majors DEVI 1 Device by Does not apply route as directed. 05/25/22   Rochester Chuck, MD  triamcinolone  ointment (KENALOG ) 0.1 % Apply 1 application to red stubborn areas below your face up to twice a day as needed. Do not use this medication longer than 2 weeks in a row. 12/23/22   Ardie Kras, FNP      Allergies    Clindamycin/lincomycin, Keflex [cephalexin], Propylene glycol, Other, and Nickel    Review of Systems   Review of Systems  Physical Exam Updated Vital Signs BP 102/63   Pulse 90   Temp 99 F (37.2 C) (Oral)   Resp 18   Ht 1.575 m (5\' 2" )   Wt 49.9 kg   LMP 08/18/2023   SpO2 100%   BMI 20.12  kg/m  Physical Exam Vitals reviewed.  Constitutional:      Appearance: She is well-developed.  HENT:     Head: Normocephalic.     Right Ear: Ear canal normal.     Left Ear: Ear canal normal.     Nose: No congestion.     Mouth/Throat:     Mouth: Mucous membranes are dry.     Pharynx: Oropharyngeal exudate present.     Tonsils: Tonsillar exudate present.  Eyes:     Conjunctiva/sclera: Conjunctivae normal.  Cardiovascular:     Rate and Rhythm: Regular rhythm. Tachycardia present.  Pulmonary:     Effort: Pulmonary effort is normal.     Breath sounds: Normal breath sounds.   Abdominal:     General: Bowel sounds are normal.     Palpations: Abdomen is soft.  Musculoskeletal:     Cervical back: Normal range of motion.  Skin:    General: Skin is warm and dry.  Neurological:     General: No focal deficit present.     Mental Status: She is alert.  Psychiatric:        Mood and Affect: Mood normal.     ED Results / Procedures / Treatments   Labs (all labs ordered are listed, but only abnormal results are displayed) Labs Reviewed  CBC - Abnormal; Notable for the following components:      Result Value   WBC 12.3 (*)    All other components within normal limits  BASIC METABOLIC PANEL WITH GFR - Abnormal; Notable for the following components:   CO2 14 (*)    Calcium 8.7 (*)    All other components within normal limits  GROUP A STREP BY PCR  RESP PANEL BY RT-PCR (RSV, FLU A&B, COVID)  RVPGX2  PREGNANCY, URINE  MONONUCLEOSIS SCREEN  I-STAT CG4 LACTIC ACID, ED  I-STAT CG4 LACTIC ACID, ED    EKG None  Radiology CT Soft Tissue Neck W Contrast Result Date: 08/19/2023 CLINICAL DATA:  Epiglottitis or tonsillitis suspected. Tonsillar swelling for 4 days. Difficulty swallowing. Difficulty talking. EXAM: CT NECK WITH CONTRAST TECHNIQUE: Multidetector CT imaging of the neck was performed using the standard protocol following the bolus administration of intravenous contrast. RADIATION DOSE REDUCTION: This exam was performed according to the departmental dose-optimization program which includes automated exposure control, adjustment of the mA and/or kV according to patient size and/or use of iterative reconstruction technique. CONTRAST:  75mL OMNIPAQUE IOHEXOL 350 MG/ML SOLN COMPARISON:  None Available. FINDINGS: Pharynx and larynx: Mild adenoid hypertrophy is present bilaterally. Hypertrophy of palatine tonsils is present bilaterally. No discrete or developing fluid collections are present. The airway is patent. Tongue base is within normal limits. Piercing is noted  anteriorly. No associated inflammatory changes or fluid collections are present. Vallecula and epiglottis are within normal limits. Aryepiglottic folds and piriform sinuses are clear. Vocal cords are midline and symmetric. Trachea is clear. Salivary glands: The submandibular and parotid glands and ducts are within normal limits. Thyroid: Normal Lymph nodes: Enlarged reactive type lymph nodes are present bilaterally. The largest nodes are at the level 2 station, left greater than right. No suppurative or rounded nodes are present. Vascular: No significant vascular disease is present. Limited intracranial: Within normal limits. Visualized orbits: The globes and orbits are within normal limits. Mastoids and visualized paranasal sinuses: The paranasal sinuses and mastoid air cells are clear. Skeleton: The vertebral body heights and alignment are normal. Straightening and some reversal of the normal cervical lordosis is present. Upper  chest: The lung apices are clear. The thoracic inlet is within normal limits. IMPRESSION: 1. Mild adenoid hypertrophy bilaterally. 2. Hypertrophy of palatine tonsils bilaterally compatible with tonsillitis. 3. No discrete or developing fluid collections are present. 4. Enlarged reactive type lymph nodes bilaterally. The largest nodes are at the level 2 station, left greater than right. No suppurative or rounded nodes are present. Electronically Signed   By: Audree Leas M.D.   On: 08/19/2023 15:56    Procedures Procedures    Medications Ordered in ED Medications  dexamethasone (DECADRON) 10 MG/ML injection (  Not Given 08/19/23 1404)  lactated ringers  bolus 1,000 mL (0 mLs Intravenous Stopped 08/19/23 1547)  morphine (PF) 2 MG/ML injection 2 mg (2 mg Intravenous Given 08/19/23 1356)  dexamethasone (DECADRON) injection 10 mg (10 mg Intravenous Given 08/19/23 1356)  iohexol (OMNIPAQUE) 350 MG/ML injection 75 mL (75 mLs Intravenous Contrast Given 08/19/23 1518)    ED Course/  Medical Decision Making/ A&P                                 Medical Decision Making Risk Prescription drug management.   23 year old female presents today with sore throat and muffled voice.  She has had decreased p.o. intake.  Symptoms have been present since Tuesday.  On her exam she has enlarged tonsils with diffuse exudate.  Patient was tested here with strep and mono which are both negative.  She has mild leukocytosis of 12,300.  Patient received Decadron and IV fluids.  CT of soft tissue of the neck do not show any evidence of abscess or discrete fluid collections.  She does have hypertrophy of her palatine tonsils and mild adenoid hypertrophy bilaterally with an enlarged reactive lymph node.  Patient appears improved after treatment.  She is taking p.o.  Discussed risks and benefits of treatment with antibiotics and will proceed with Augmentin.  She has known history of allergies to clindamycin and Keflex. Airway is intact and she has not had any difficulty.  She is able to speak clearly. We have discussed return precautions including worsening symptoms, inability to swallow, or difficulty breathing and need for follow-up.        Final Clinical Impression(s) / ED Diagnoses Final diagnoses:  Acute pharyngitis, unspecified etiology    Rx / DC Orders ED Discharge Orders     None         Auston Blush, MD 08/19/23 1656

## 2023-08-19 NOTE — ED Triage Notes (Signed)
 Pt arrives via POV from home with tonsilar swelling since Tuesday, noted white patches. Pt unable to talk, with difficulty swallowing. Breathing unlabored.

## 2023-08-19 NOTE — ED Notes (Signed)
 Patient transported to CT

## 2023-08-19 NOTE — ED Provider Triage Note (Signed)
 Emergency Medicine Provider Triage Evaluation Note  Deborah Fernandez , a 23 y.o. female  was evaluated in triage.  Pt complains of trouble swallowing, trouble speaking.  Reports that she has had sore throat for the last 1 week.  States she was seen at her PCPs office, strep throat swab was obtained which was negative and they discharged her.  Reports she is having trouble swallowing and trouble speaking.  Endorsing trouble breathing.  Patient does have a high potato voice in the room.  She has 3+ bilateral tonsillar swelling with patchy white exudate.  She is endorsing nausea, weakness, fever at home.  Review of Systems  Positive:  Negative:   Physical Exam  BP 128/84 (BP Location: Right Arm)   Pulse (!) 124   Temp 99.6 F (37.6 C)   Resp 16   Ht 5\' 2"  (1.575 m)   Wt 49.9 kg   LMP 08/18/2023   SpO2 98%   BMI 20.12 kg/m  Gen:   Awake, no distress   Resp:  Normal effort  MSK:   Moves extremities without difficulty  Other:    Medical Decision Making  Medically screening exam initiated at 12:42 PM.  Appropriate orders placed.  Deborah Fernandez was informed that the remainder of the evaluation will be completed by another provider, this initial triage assessment does not replace that evaluation, and the importance of remaining in the ED until their evaluation is complete.     Adel Aden, PA-C 08/19/23 1243

## 2023-08-19 NOTE — Discharge Instructions (Addendum)
 Please take antibiotics as prescribed.  Drink plenty of fluids.  It is sometimes helpful to use popsicles.  You should be urinating every 3 hours. Return to the emergency department if you are having any difficulty breathing or unable to swallow fluids You have a prescription for Augmentin sent to the [facility] in [place].  Please take all antibiotics as prescribed Please use acetaminophen  and ibuprofen  for pain.

## 2023-08-21 LAB — I-STAT CG4 LACTIC ACID, ED: Lactic Acid, Venous: 0.8 mmol/L (ref 0.5–1.9)

## 2023-08-23 ENCOUNTER — Other Ambulatory Visit: Payer: Self-pay | Admitting: Allergy & Immunology

## 2023-09-25 ENCOUNTER — Other Ambulatory Visit: Payer: Self-pay | Admitting: Allergy & Immunology

## 2023-09-27 ENCOUNTER — Other Ambulatory Visit: Payer: Self-pay | Admitting: Allergy & Immunology

## 2023-10-31 ENCOUNTER — Other Ambulatory Visit: Payer: Self-pay | Admitting: Allergy & Immunology

## 2023-11-03 ENCOUNTER — Ambulatory Visit: Payer: 59 | Admitting: Family Medicine

## 2023-11-03 ENCOUNTER — Other Ambulatory Visit: Payer: Self-pay

## 2023-11-03 ENCOUNTER — Encounter: Payer: Self-pay | Admitting: Allergy & Immunology

## 2023-11-03 VITALS — BP 100/80 | HR 114 | Temp 98.6°F | Resp 16

## 2023-11-03 DIAGNOSIS — J3089 Other allergic rhinitis: Secondary | ICD-10-CM

## 2023-11-03 DIAGNOSIS — B999 Unspecified infectious disease: Secondary | ICD-10-CM

## 2023-11-03 DIAGNOSIS — J4540 Moderate persistent asthma, uncomplicated: Secondary | ICD-10-CM

## 2023-11-03 DIAGNOSIS — J302 Other seasonal allergic rhinitis: Secondary | ICD-10-CM

## 2023-11-03 DIAGNOSIS — L2089 Other atopic dermatitis: Secondary | ICD-10-CM | POA: Diagnosis not present

## 2023-11-03 MED ORDER — LEVOCETIRIZINE DIHYDROCHLORIDE 5 MG PO TABS: 5.0000 mg | ORAL_TABLET | Freq: Every day | ORAL | 5 refills | Status: AC | PRN

## 2023-11-03 MED ORDER — ALBUTEROL SULFATE (2.5 MG/3ML) 0.083% IN NEBU: 2.5000 mg | INHALATION_SOLUTION | RESPIRATORY_TRACT | 0 refills | Status: AC | PRN

## 2023-11-03 MED ORDER — BUDESONIDE-FORMOTEROL FUMARATE 80-4.5 MCG/ACT IN AERO: 2.0000 | INHALATION_SPRAY | Freq: Two times a day (BID) | RESPIRATORY_TRACT | 5 refills | Status: DC

## 2023-11-03 MED ORDER — TRIAMCINOLONE ACETONIDE 0.1 % EX OINT: TOPICAL_OINTMENT | CUTANEOUS | 5 refills | Status: AC

## 2023-11-03 MED ORDER — ALBUTEROL SULFATE HFA 108 (90 BASE) MCG/ACT IN AERS: 2.0000 | INHALATION_SPRAY | RESPIRATORY_TRACT | 3 refills | Status: AC | PRN

## 2023-11-03 NOTE — Progress Notes (Signed)
 [address] [place] [address] Dept: [phone]  FOLLOW UP NOTE  Patient ID: Deborah Fernandez, female    DOB: Jun 05, 2000  Age: 23 y.o. MRN: [id number] Date of Office Visit: 11/03/2023  Assessment  Chief Complaint: Follow-up (Allergies/Asthma/)  HPI Deborah Fernandez is a 23 year old female who returns to the clinic for a follow-up visit.  She was last seen in this clinic on 04/25/2023 by Dr. Iva for evaluation of asthma, allergic rhinitis, recurrent infection, and atopic dermatitis.  At today's visit, she reports her asthma has been only moderately well-controlled with symptoms including intermittent chest tightness, shortness of breath when laughing or talking, dry cough when laughing or talking, and occasional wheeze.  She reports that she used her brothers Symbicort  starting 3 weeks ago with relief of the symptoms and requiring fewer uses of albuterol .  Allergic rhinitis is reported as moderately well-controlled with occasional nasal symptoms including rhinorrhea and nasal congestion.  She continues Benadryl  as needed for relief of symptoms.  She is currently breast-feeding.  We discussed the benefits and risks of Benadryl  during breast-feeding.  She is interested in using a long term second-generation antihistamine at this time as needed.  Atopic dermatitis is reported as moderately well-controlled with only occasional red and itchy areas.  She continues to twice a day moisturizing routine and occasionally uses triamcinolone  to stubborn red and itchy areas.  She does report several infections during the month of June requiring 3 rounds of antibiotics and steroid.  She did visit the emergency department on 08/19/2023 for acute pharyngitis for which she was given a fluid bolus, morphine , Decadron .  Testing was negative to strep and mono.  CT of soft tissue of the neck did not show any evidence of abscess or discrete fluid collections.  She was provided with a prescription for  Augmentin  with relief of symptoms.  Her current medications are listed in the chart.  Chart review:   Drug Allergies:  INDINGS: Pharynx and larynx: Mild adenoid hypertrophy is present bilaterally. Hypertrophy of palatine tonsils is present bilaterally. No discrete or developing fluid collections are present. The airway is patent. Tongue base is within normal limits. Piercing is noted anteriorly. No associated inflammatory changes or fluid collections are present. Vallecula and epiglottis are within normal limits. Aryepiglottic folds and piriform sinuses are clear. Vocal cords are midline and symmetric. Trachea is clear.   Salivary glands: The submandibular and parotid glands and ducts are within normal limits.   Thyroid: Normal   Lymph nodes: Enlarged reactive type lymph nodes are present bilaterally. The largest nodes are at the level 2 station, left greater than right. No suppurative or rounded nodes are present.   Vascular: No significant vascular disease is present.   Limited intracranial: Within normal limits.   Visualized orbits: The globes and orbits are within normal limits.   Mastoids and visualized paranasal sinuses: The paranasal sinuses and mastoid air cells are clear.   Skeleton: The vertebral body heights and alignment are normal. Straightening and some reversal of the normal cervical lordosis is present.   Upper chest: The lung apices are clear. The thoracic inlet is within normal limits.   IMPRESSION: 1. Mild adenoid hypertrophy bilaterally. 2. Hypertrophy of palatine tonsils bilaterally compatible with tonsillitis. 3. No discrete or developing fluid collections are present. 4. Enlarged reactive type lymph nodes bilaterally. The largest nodes are at the level 2 station, left greater than right. No suppurative or rounded nodes are present.     Electronically Signed  By: Lonni Necessary M.D.   On: 08/19/2023 15:56   Physical Exam: BP  100/80   Pulse (!) 114   Temp 98.6 F (37 C)   Resp 16   SpO2 98%    Physical Exam Vitals reviewed.  Constitutional:      Appearance: Normal appearance.  HENT:     Head: Normocephalic and atraumatic.     Right Ear: Tympanic membrane normal.     Left Ear: Tympanic membrane normal.     Nose:     Comments: Bilateral nares slightly erythematous with thin clear nasal drainage noted.  Pharynx normal.  Ears normal.  Eyes normal.    Mouth/Throat:     Pharynx: Oropharynx is clear.  Eyes:     Conjunctiva/sclera: Conjunctivae normal.  Cardiovascular:     Rate and Rhythm: Normal rate and regular rhythm.     Heart sounds: Normal heart sounds. No murmur heard. Pulmonary:     Effort: Pulmonary effort is normal.     Breath sounds: Normal breath sounds.  Musculoskeletal:        General: Normal range of motion.     Cervical back: Normal range of motion and neck supple.  Skin:    General: Skin is warm and dry.  Neurological:     Mental Status: She is alert and oriented to person, place, and time.  Psychiatric:        Mood and Affect: Mood normal.        Behavior: Behavior normal.        Thought Content: Thought content normal.        Judgment: Judgment normal.     Diagnostics: FVC 3.39 which is 94% of predicted value, FEV1 2.93 which is 93% of predicted value. Spirometry indicates normal ventilatory function.    Assessment and Plan: 1. Not well controlled moderate persistent asthma   2. Flexural atopic dermatitis   3. Recurrent infections   4. Seasonal and perennial allergic rhinitis     Meds ordered this encounter  Medications   albuterol  (PROVENTIL ) (2.5 MG/3ML) 0.083% nebulizer solution    Sig: Take 3 mLs (2.5 mg total) by nebulization every 4 (four) hours as needed for wheezing or shortness of breath.    Dispense:  75 mL    Refill:  0   albuterol  (VENTOLIN  HFA) 108 (90 Base) MCG/ACT inhaler    Sig: Inhale 2 puffs into the lungs every 4 (four) hours as needed for wheezing  or shortness of breath.    Dispense:  8 g    Refill:  3   levocetirizine (XYZAL ) 5 MG tablet    Sig: Take 1 tablet (5 mg total) by mouth daily as needed (Can take an extra dose during flare ups.).    Dispense:  60 tablet    Refill:  5   triamcinolone  ointment (KENALOG ) 0.1 %    Sig: Apply 1 application to red stubborn areas below your face up to twice a day as needed. Do not use this medication longer than 2 weeks in a row.    Dispense:  80 g    Refill:  5   budesonide -formoterol  (SYMBICORT ) 80-4.5 MCG/ACT inhaler    Sig: Inhale 2 puffs into the lungs 2 (two) times daily.    Dispense:  1 each    Refill:  5    Patient Instructions  Asthma Begin Symbicort  80-2 puffs once a day with a spacer to prevent cough or wheeze Continue albuterol  2 puffs once every 4 hours as  needed for cough or wheeze You may use albuterol  2 puffs 5 to 15 minutes before activity to decrease cough or wheeze For asthma flare, increase Symbicort  80 to 2 puffs twice a day for 1-2 weeks or until cough and wheeze free, then return to original dosing  Allergic rhinitis Continue allergen avoidance measures directed toward grass pollen, weed pollen, ragweed pollen, tree pollen, indoor mold, outdoor mold, dust mite, cat, dog, and cockroach as listed below Codes provided to call insurance company about cost of allergen immunotherapy Begin Claritin (loratidine) 10 mg or Zyrtec (cetirizine) 10 mg once a day as needed for runny nose or itch Consider saline nasal rinses as needed for nasal symptoms. Use this before any medicated nasal sprays for best result  Atopic dermatitis Continue a twice a day moisturizing routine Continue triamcinolone  0.1% ointment to red stubborn areas below your face up to twice a day as needed. Do not use this medication longer than 2 weeks in a row.   Recurrent infection Keep track of infections, antibiotic use, and steroid use  Call the clinic if this treatment plan is not working well for  you.  Follow up in 3 months or sooner if needed.  Return in about 3 months (around 02/03/2024), or if symptoms worsen or fail to improve.    Thank you for the opportunity to care for this patient.  Please do not hesitate to contact me with questions.  Arlean Mutter, FNP [facility] 

## 2023-11-03 NOTE — Patient Instructions (Addendum)
 Asthma Begin Symbicort  80-2 puffs once a day with a spacer to prevent cough or wheeze Continue albuterol  2 puffs once every 4 hours as needed for cough or wheeze You may use albuterol  2 puffs 5 to 15 minutes before activity to decrease cough or wheeze For asthma flare, increase Symbicort  80 to 2 puffs twice a day for 1-2 weeks or until cough and wheeze free, then return to original dosing  Allergic rhinitis Continue allergen avoidance measures directed toward grass pollen, weed pollen, ragweed pollen, tree pollen, indoor mold, outdoor mold, dust mite, cat, dog, and cockroach as listed below Codes provided to call insurance company about cost of allergen immunotherapy Begin Claritin (loratidine) 10 mg or Zyrtec (cetirizine) 10 mg once a day as needed for runny nose or itch Consider saline nasal rinses as needed for nasal symptoms. Use this before any medicated nasal sprays for best result  Atopic dermatitis Continue a twice a day moisturizing routine Continue triamcinolone  0.1% ointment to red stubborn areas below your face up to twice a day as needed. Do not use this medication longer than 2 weeks in a row.   Recurrent infection Keep track of infections, antibiotic use, and steroid use  Call the clinic if this treatment plan is not working well for you.  Follow up in 3 months or sooner if needed.  Reducing Pollen Exposure The American Academy of Allergy , Asthma and Immunology suggests the following steps to reduce your exposure to pollen during allergy  seasons. Do not hang sheets or clothing out to dry; pollen may collect on these items. Do not mow lawns or spend time around freshly cut grass; mowing stirs up pollen. Keep windows closed at night.  Keep car windows closed while driving. Minimize morning activities outdoors, a time when pollen counts are usually at their highest. Stay indoors as much as possible when pollen counts or humidity is high and on windy days when pollen tends to  remain in the air longer. Use air conditioning when possible.  Many air conditioners have filters that trap the pollen spores. Use a HEPA room air filter to remove pollen form the indoor air you breathe.  Control of Mold Allergen Mold and fungi can grow on a variety of surfaces provided certain temperature and moisture conditions exist.  Outdoor molds grow on plants, decaying vegetation and soil.  The major outdoor mold, Alternaria and Cladosporium, are found in very high numbers during hot and dry conditions.  Generally, a late Summer - Fall peak is seen for common outdoor fungal spores.  Rain will temporarily lower outdoor mold spore count, but counts rise rapidly when the rainy period ends.  The most important indoor molds are Aspergillus and Penicillium.  Dark, humid and poorly ventilated basements are ideal sites for mold growth.  The next most common sites of mold growth are the bathroom and the kitchen.  Outdoor [employer] Use air conditioning and keep windows closed Avoid exposure to decaying vegetation. Avoid leaf raking. Avoid grain handling. Consider wearing a face mask if working in moldy areas.  Indoor Mold Control Maintain humidity below 50%. Clean washable surfaces with 5% bleach solution. Remove sources e.g. Contaminated carpets.   Control of Dust Mite Allergen Dust mites play a major role in allergic asthma and rhinitis. They occur in environments with high humidity wherever human skin is found. Dust mites absorb humidity from the atmosphere (ie, they do not drink) and feed on organic matter (including shed human and animal skin). Dust mites are a  microscopic type of insect that you cannot see with the naked eye. High levels of dust mites have been detected from mattresses, pillows, carpets, upholstered furniture, bed covers, clothes, soft toys and any woven material. The principal allergen of the dust mite is found in its feces. A gram of dust may contain 1,000 mites and  250,000 fecal particles. Mite antigen is easily measured in the air during house cleaning activities. Dust mites do not bite and do not cause harm to humans, other than by triggering allergies/asthma.  Ways to decrease your exposure to dust mites in your home:  1. Encase mattresses, box springs and pillows with a mite-impermeable barrier or cover  2. Wash sheets, blankets and drapes weekly in hot water  (130 F) with detergent and dry them in a dryer on the hot setting.  3. Have the room cleaned frequently with a vacuum cleaner and a damp dust-mop. For carpeting or rugs, vacuuming with a vacuum cleaner equipped with a high-efficiency particulate air (HEPA) filter. The dust mite allergic individual should not be in a room which is being cleaned and should wait 1 hour after cleaning before going into the room.  4. Do not sleep on upholstered furniture (eg, couches).  5. If possible removing carpeting, upholstered furniture and drapery from the home is ideal. Horizontal blinds should be eliminated in the rooms where the person spends the most time (bedroom, study, television room). Washable vinyl, roller-type shades are optimal.  6. Remove all non-washable stuffed toys from the bedroom. Wash stuffed toys weekly like sheets and blankets above.  7. Reduce indoor humidity to less than 50%. Inexpensive humidity monitors can be purchased at most hardware stores. Do not use a humidifier as can make the problem worse and are not recommended.  Control of Dog or Cat Allergen Avoidance is the best way to manage a dog or cat allergy . If you have a dog or cat and are allergic to dog or cats, consider removing the dog or cat from the home. If you have a dog or cat but don't want to find it a new home, or if your family wants a pet even though someone in the household is allergic, here are some strategies that may help keep symptoms at bay:  Keep the pet out of your bedroom and restrict it to only a few rooms.  Be advised that keeping the dog or cat in only one room will not limit the allergens to that room. Don't pet, hug or kiss the dog or cat; if you do, wash your hands with soap and water . High-efficiency particulate air (HEPA) cleaners run continuously in a bedroom or living room can reduce allergen levels over time. Regular use of a high-efficiency vacuum cleaner or a central vacuum can reduce allergen levels. Giving your dog or cat a bath at least once a week can reduce airborne allergen.  Control of Cockroach Allergen Cockroach allergen has been identified as an important cause of acute attacks of asthma, especially in urban settings.  There are fifty-five species of cockroach that exist in the [place] , however only three, the [place], [place] and [place] species produce allergen that can affect patients with Asthma.  Allergens can be obtained from fecal particles, egg casings and secretions from cockroaches.    Remove food sources. Reduce access to water . Seal access and entry points. Spray runways with 0.5-1% Diazinon or Chlorpyrifos Blow boric acid power under stoves and refrigerator. Place bait stations (hydramethylnon) at feeding sites.

## 2023-11-04 ENCOUNTER — Encounter: Payer: Self-pay | Admitting: Family Medicine

## 2023-11-10 NOTE — Addendum Note (Signed)
 Addended by: FRANCIS ROULEAU A on: 11/10/2023 12:16 PM   Modules accepted: Orders

## 2024-02-07 ENCOUNTER — Ambulatory Visit: Admitting: Family Medicine

## 2024-02-07 NOTE — Patient Instructions (Incomplete)
 Asthma Continue Symbicort  80-2 puffs once a day with a spacer to prevent cough or wheeze Continue albuterol  2 puffs once every 4 hours as needed for cough or wheeze You may use albuterol  2 puffs 5 to 15 minutes before activity to decrease cough or wheeze For asthma flare, increase Symbicort  80 to 2 puffs twice a day for 1-2 weeks or until cough and wheeze free, then return to original dosing  Allergic rhinitis Continue allergen avoidance measures directed toward grass pollen, weed pollen, ragweed pollen, tree pollen, indoor mold, outdoor mold, dust mite, cat, dog, and cockroach as listed below Codes provided to call insurance company about cost of allergen immunotherapy Begin Claritin (loratidine) 10 mg or Zyrtec (cetirizine) 10 mg once a day as needed for runny nose or itch Consider saline nasal rinses as needed for nasal symptoms. Use this before any medicated nasal sprays for best result  Atopic dermatitis Continue a twice a day moisturizing routine Continue triamcinolone  0.1% ointment to red stubborn areas below your face up to twice a day as needed. Do not use this medication longer than 2 weeks in a row.   Recurrent infection Keep track of infections, antibiotic use, and steroid use  Call the clinic if this treatment plan is not working well for you.  Follow up in 3 months or sooner if needed.  Reducing Pollen Exposure The American Academy of Allergy , Asthma and Immunology suggests the following steps to reduce your exposure to pollen during allergy  seasons. Do not hang sheets or clothing out to dry; pollen may collect on these items. Do not mow lawns or spend time around freshly cut grass; mowing stirs up pollen. Keep windows closed at night.  Keep car windows closed while driving. Minimize morning activities outdoors, a time when pollen counts are usually at their highest. Stay indoors as much as possible when pollen counts or humidity is high and on windy days when pollen tends  to remain in the air longer. Use air conditioning when possible.  Many air conditioners have filters that trap the pollen spores. Use a HEPA room air filter to remove pollen form the indoor air you breathe.  Control of Mold Allergen Mold and fungi can grow on a variety of surfaces provided certain temperature and moisture conditions exist.  Outdoor molds grow on plants, decaying vegetation and soil.  The major outdoor mold, Alternaria and Cladosporium, are found in very high numbers during hot and dry conditions.  Generally, a late Summer - Fall peak is seen for common outdoor fungal spores.  Rain will temporarily lower outdoor mold spore count, but counts rise rapidly when the rainy period ends.  The most important indoor molds are Aspergillus and Penicillium.  Dark, humid and poorly ventilated basements are ideal sites for mold growth.  The next most common sites of mold growth are the bathroom and the kitchen.  Outdoor [employer] Use air conditioning and keep windows closed Avoid exposure to decaying vegetation. Avoid leaf raking. Avoid grain handling. Consider wearing a face mask if working in moldy areas.  Indoor Mold Control Maintain humidity below 50%. Clean washable surfaces with 5% bleach solution. Remove sources e.g. Contaminated carpets.   Control of Dust Mite Allergen Dust mites play a major role in allergic asthma and rhinitis. They occur in environments with high humidity wherever human skin is found. Dust mites absorb humidity from the atmosphere (ie, they do not drink) and feed on organic matter (including shed human and animal skin). Dust mites are a  microscopic type of insect that you cannot see with the naked eye. High levels of dust mites have been detected from mattresses, pillows, carpets, upholstered furniture, bed covers, clothes, soft toys and any woven material. The principal allergen of the dust mite is found in its feces. A gram of dust may contain 1,000 mites and  250,000 fecal particles. Mite antigen is easily measured in the air during house cleaning activities. Dust mites do not bite and do not cause harm to humans, other than by triggering allergies/asthma.  Ways to decrease your exposure to dust mites in your home:  1. Encase mattresses, box springs and pillows with a mite-impermeable barrier or cover  2. Wash sheets, blankets and drapes weekly in hot water  (130 F) with detergent and dry them in a dryer on the hot setting.  3. Have the room cleaned frequently with a vacuum cleaner and a damp dust-mop. For carpeting or rugs, vacuuming with a vacuum cleaner equipped with a high-efficiency particulate air (HEPA) filter. The dust mite allergic individual should not be in a room which is being cleaned and should wait 1 hour after cleaning before going into the room.  4. Do not sleep on upholstered furniture (eg, couches).  5. If possible removing carpeting, upholstered furniture and drapery from the home is ideal. Horizontal blinds should be eliminated in the rooms where the person spends the most time (bedroom, study, television room). Washable vinyl, roller-type shades are optimal.  6. Remove all non-washable stuffed toys from the bedroom. Wash stuffed toys weekly like sheets and blankets above.  7. Reduce indoor humidity to less than 50%. Inexpensive humidity monitors can be purchased at most hardware stores. Do not use a humidifier as can make the problem worse and are not recommended.  Control of Dog or Cat Allergen Avoidance is the best way to manage a dog or cat allergy . If you have a dog or cat and are allergic to dog or cats, consider removing the dog or cat from the home. If you have a dog or cat but don't want to find it a new home, or if your family wants a pet even though someone in the household is allergic, here are some strategies that may help keep symptoms at bay:  Keep the pet out of your bedroom and restrict it to only a few rooms.  Be advised that keeping the dog or cat in only one room will not limit the allergens to that room. Don't pet, hug or kiss the dog or cat; if you do, wash your hands with soap and water . High-efficiency particulate air (HEPA) cleaners run continuously in a bedroom or living room can reduce allergen levels over time. Regular use of a high-efficiency vacuum cleaner or a central vacuum can reduce allergen levels. Giving your dog or cat a bath at least once a week can reduce airborne allergen.  Control of Cockroach Allergen Cockroach allergen has been identified as an important cause of acute attacks of asthma, especially in urban settings.  There are fifty-five species of cockroach that exist in the [place] , however only three, the [place], [place] and [place] species produce allergen that can affect patients with Asthma.  Allergens can be obtained from fecal particles, egg casings and secretions from cockroaches.    Remove food sources. Reduce access to water . Seal access and entry points. Spray runways with 0.5-1% Diazinon or Chlorpyrifos Blow boric acid power under stoves and refrigerator. Place bait stations (hydramethylnon) at feeding sites.

## 2024-02-07 NOTE — Progress Notes (Deleted)
   [address] [place] [address] Dept: [phone]  FOLLOW UP NOTE  Patient ID: Deborah Fernandez, female    DOB: 05-10-2000  Age: 23 y.o. MRN: [id number] Date of Office Visit: 02/07/2024  Assessment  Chief Complaint: No chief complaint on file.  HPI Deborah Fernandez is a 23 year old female who presents to the clinic for follow-up visit.  She was last seen in this clinic on 11/03/2023 for evaluation of asthma, allergic rhinitis, atopic dermatitis, recurrent section.   Her last environmental allergy  skin testing was on 09/13/2018 was positive to grass pollen, weed pollen, ragweed pollen, tree pollen, mold, dust mite, cat, dog, and cockroach  Discussed the use of AI scribe software for clinical note transcription with the patient, who gave verbal consent to proceed.  History of Present Illness      Drug Allergies:  Allergies  Allergen Reactions   Clindamycin/Lincomycin Hives   Keflex [Cephalexin] Hives   Propylene Glycol Hives    Per (+)skin testing due to evaluation of  bad break outs on hands/feet- parent was told to avoid per d/w Mom   Other Swelling    almonds   Nickel Swelling    Physical Exam: There were no vitals taken for this visit.   Physical Exam  Diagnostics:    Assessment and Plan: No diagnosis found.  No orders of the defined types were placed in this encounter.   There are no Patient Instructions on file for this visit.  No follow-ups on file.    Thank you for the opportunity to care for this patient.  Please do not hesitate to contact me with questions.  Arlean Mutter, FNP [facility]

## 2024-04-25 NOTE — Patient Instructions (Addendum)
 Asthma Begin Symbicort  ([place]) 80-2 puffs once a day with a spacer to prevent cough or wheeze Continue albuterol  2 puffs once every 4 hours as needed for cough or wheeze You may use albuterol  2 puffs 5 to 15 minutes before activity to decrease cough or wheeze For asthma flare, increase Symbicort  80 to 2 puffs twice a day for 1-2 weeks or until cough and wheeze free, then return to original dosing  Allergic rhinitis Continue allergen avoidance measures directed toward grass pollen, weed pollen, ragweed pollen, tree pollen, indoor mold, outdoor mold, dust mite, cat, dog, and cockroach as listed below Codes provided to call insurance company about cost of allergen immunotherapy Begin Claritin (loratidine) 10 mg or Zyrtec (cetirizine) 10 mg once a day as needed for runny nose or itch Consider saline nasal rinses as needed for nasal symptoms. Use this before any medicated nasal sprays for best result  Atopic dermatitis Continue a twice a day moisturizing routine Continue triamcinolone  0.1% ointment to red stubborn areas below your face up to twice a day as needed. Do not use this medication longer than 2 weeks in a row.   Rash A lab has been ordered to help us  evaluate your rash. We will call you when the results become available If your symptoms re-occur, begin a journal of events that occurred for up to 6 hours before your symptoms began including foods and beverages consumed, soaps or perfumes you had contact with, and medications.   Recurrent infection Keep track of infections, antibiotic use, and steroid use  Call the clinic if this treatment plan is not working well for you.  Follow up in 6 months or sooner if needed.  Reducing Pollen Exposure The American Academy of Allergy , Asthma and Immunology suggests the following steps to reduce your exposure to pollen during allergy  seasons. Do not hang sheets or clothing out to dry; pollen may collect on these items. Do not mow lawns or spend  time around freshly cut grass; mowing stirs up pollen. Keep windows closed at night.  Keep car windows closed while driving. Minimize morning activities outdoors, a time when pollen counts are usually at their highest. Stay indoors as much as possible when pollen counts or humidity is high and on windy days when pollen tends to remain in the air longer. Use air conditioning when possible.  Many air conditioners have filters that trap the pollen spores. Use a HEPA room air filter to remove pollen form the indoor air you breathe.  Control of Mold Allergen Mold and fungi can grow on a variety of surfaces provided certain temperature and moisture conditions exist.  Outdoor molds grow on plants, decaying vegetation and soil.  The major outdoor mold, Alternaria and Cladosporium, are found in very high numbers during hot and dry conditions.  Generally, a late Summer - Fall peak is seen for common outdoor fungal spores.  Rain will temporarily lower outdoor mold spore count, but counts rise rapidly when the rainy period ends.  The most important indoor molds are Aspergillus and Penicillium.  Dark, humid and poorly ventilated basements are ideal sites for mold growth.  The next most common sites of mold growth are the bathroom and the kitchen.  Outdoor [employer] Use air conditioning and keep windows closed Avoid exposure to decaying vegetation. Avoid leaf raking. Avoid grain handling. Consider wearing a face mask if working in moldy areas.  Indoor Mold Control Maintain humidity below 50%. Clean washable surfaces with 5% bleach solution. Remove sources e.g. Contaminated  carpets.   Control of Dust Mite Allergen Dust mites play a major role in allergic asthma and rhinitis. They occur in environments with high humidity wherever human skin is found. Dust mites absorb humidity from the atmosphere (ie, they do not drink) and feed on organic matter (including shed human and animal skin). Dust mites are a  microscopic type of insect that you cannot see with the naked eye. High levels of dust mites have been detected from mattresses, pillows, carpets, upholstered furniture, bed covers, clothes, soft toys and any woven material. The principal allergen of the dust mite is found in its feces. A gram of dust may contain 1,000 mites and 250,000 fecal particles. Mite antigen is easily measured in the air during house cleaning activities. Dust mites do not bite and do not cause harm to humans, other than by triggering allergies/asthma.  Ways to decrease your exposure to dust mites in your home:  1. Encase mattresses, box springs and pillows with a mite-impermeable barrier or cover  2. Wash sheets, blankets and drapes weekly in hot water  (130 F) with detergent and dry them in a dryer on the hot setting.  3. Have the room cleaned frequently with a vacuum cleaner and a damp dust-mop. For carpeting or rugs, vacuuming with a vacuum cleaner equipped with a high-efficiency particulate air (HEPA) filter. The dust mite allergic individual should not be in a room which is being cleaned and should wait 1 hour after cleaning before going into the room.  4. Do not sleep on upholstered furniture (eg, couches).  5. If possible removing carpeting, upholstered furniture and drapery from the home is ideal. Horizontal blinds should be eliminated in the rooms where the person spends the most time (bedroom, study, television room). Washable vinyl, roller-type shades are optimal.  6. Remove all non-washable stuffed toys from the bedroom. Wash stuffed toys weekly like sheets and blankets above.  7. Reduce indoor humidity to less than 50%. Inexpensive humidity monitors can be purchased at most hardware stores. Do not use a humidifier as can make the problem worse and are not recommended.  Control of Dog or Cat Allergen Avoidance is the best way to manage a dog or cat allergy . If you have a dog or cat and are allergic to dog or  cats, consider removing the dog or cat from the home. If you have a dog or cat but dont want to find it a new home, or if your family wants a pet even though someone in the household is allergic, here are some strategies that may help keep symptoms at bay:  Keep the pet out of your bedroom and restrict it to only a few rooms. Be advised that keeping the dog or cat in only one room will not limit the allergens to that room. Dont pet, hug or kiss the dog or cat; if you do, wash your hands with soap and water . High-efficiency particulate air (HEPA) cleaners run continuously in a bedroom or living room can reduce allergen levels over time. Regular use of a high-efficiency vacuum cleaner or a central vacuum can reduce allergen levels. Giving your dog or cat a bath at least once a week can reduce airborne allergen.  Control of Cockroach Allergen Cockroach allergen has been identified as an important cause of acute attacks of asthma, especially in urban settings.  There are fifty-five species of cockroach that exist in the [place] , however only three, the [place], [place] and [place] species produce allergen that can affect  patients with Asthma.  Allergens can be obtained from fecal particles, egg casings and secretions from cockroaches.    Remove food sources. Reduce access to water . Seal access and entry points. Spray runways with 0.5-1% Diazinon or Chlorpyrifos Blow boric acid power under stoves and refrigerator. Place bait stations (hydramethylnon) at feeding sites.

## 2024-04-25 NOTE — Progress Notes (Signed)
 "  [address] [place] [place] [address] Dept: [phone]  FOLLOW UP NOTE  Patient ID: Deborah Fernandez, female    DOB: April 24, 2000  Age: 24 y.o. MRN: [id number] Date of Office Visit: 04/26/2024  Assessment  Chief Complaint: Asthma and Follow-up  HPI Deborah Fernandez is a 24 year old female who presents to the clinic for a follow-up visit.  She was last seen in this clinic on 11/03/2023 by Arlean Mutter, FNP, for evaluation of asthma, allergic rhinitis, atopic dermatitis, and recurrent infection.   Her last environmental allergy  skin testing was on 09/13/2018 was positive to grass pollen, weed pollen, ragweed pollen, tree pollen, mold, dust mite, cat, dog, and cockroach  Initial immune screening on 09/2021 indicated 6 out of 23 protective pneumococcal serotypes which increased to 23 out of 23 when remeasured on 10/08/2019.  Discussed the use of AI scribe software for clinical note transcription with the patient, who gave verbal consent to proceed.  History of Present Illness Deborah Fernandez is a 24 year old female with asthma who presents for follow-up regarding her respiratory symptoms.  At today's visit, asthma is reported as moderately well-controlled with symptoms including occasional chest tightness and occasional cough producing thin clear mucus.  She denies shortness of breath or wheeze with activity or rest.  She reports these symptoms are likely due to weather change.  She continues Symbicort  80-2 puffs once a day with a spacer and has used albuterol  a couple of times since her last visit to this clinic with relief of symptoms.  Allergic rhinitis is reported as moderately well-controlled with nasal congestion and postnasal drainage as the main symptoms. She experiences occasional sneezing.  She does report that a couple of days ago she sneezed out a green glob of mucus from her nose.  She denies recent fever, sweats, chills, or sick contacts.  She does report  some mild illness prior to the Christmas holiday, however, symptoms have completely resolved at this time.  She does take Claritin occasionally and is not using any nasal sprays at this time. Her last environmental allergy  skin testing was on 09/13/2018 was positive to grass pollen, weed pollen, ragweed pollen, tree pollen, mold, dust mite, cat, dog, and cockroach  Atopic dermatitis is reported as well-controlled with rare red or itchy areas.  She continues a daily moisturizing routine with Vaseline and is not currently using triamcinolone .  She mentions a persistent skin issue on her legs, initially thought to be flea bites that appeared about 2 months ago. She denies new medications, personal care products, new foods, or insect stings.  She reports that nobody else in the home has this rash.  They have since given away their dog and symptoms persist.  The rash becomes more pronounced when she is sick but does not itch or hurt. She has tried unscented lotions and Vaseline without improvement. The rash has been present since the end of summer and persists despite covering up with clothing in colder weather. No medicated creams have been used.  She reports that she thought this was due to shaving her legs, however, the rash persisted even though she did not shave her legs for several months.  She denies nosebleeds, blood in urine, or blood in stool.  Last CBC on 08/19/2023 indicates platelets 293.  She does report 1 suspected urinary tract infection for which she took Bactrim for short period of time.  She received notice that the urine specimen was negative for bacteria and Bactrim was stopped at that  time.  No upper respiratory infections requiring antibiotics since her last visit to this clinic.  Her current medications are listed in the chart.   Drug Allergies:  Allergies[1]  Physical Exam: BP 104/72 (BP Location: Right Arm, Patient Position: Sitting, Cuff Size: Normal)   Pulse 86   Temp 98.4 F  (36.9 C) (Temporal)   Wt 132 lb 3.2 oz (60 kg)   SpO2 96%   BMI 24.18 kg/m    Physical Exam Vitals reviewed.  Constitutional:      Appearance: Normal appearance.  HENT:     Head: Normocephalic and atraumatic.     Right Ear: Tympanic membrane normal.     Left Ear: Tympanic membrane normal.     Nose:     Comments: Bilateral nares slightly erythematous with thin clear nasal drainage noted.  Pharynx normal.  Ears normal.  Eyes normal.    Mouth/Throat:     Pharynx: Oropharynx is clear.  Eyes:     Conjunctiva/sclera: Conjunctivae normal.  Cardiovascular:     Rate and Rhythm: Normal rate and regular rhythm.     Heart sounds: Normal heart sounds. No murmur heard. Pulmonary:     Effort: Pulmonary effort is normal.     Breath sounds: Normal breath sounds.     Comments: Lungs clear to auscultation Musculoskeletal:     Cervical back: Normal range of motion and neck supple.  Skin:    General: Skin is warm and dry.     Comments: Flat, red, pinpoint rash noted bilateral lower extremities.  Areas do not blanch.  Some areas are scabbed over.  Neurological:     Mental Status: She is alert and oriented to person, place, and time.  Psychiatric:        Mood and Affect: Mood normal.        Behavior: Behavior normal.        Thought Content: Thought content normal.        Judgment: Judgment normal.     Diagnostics: FVC 3.38 which is 94% of predicted value, FEV1 2.79 which is 89% of predicted value.  Spirometry indicates normal ventilatory function.  Assessment and Plan: 1. Moderate persistent asthma without complication   2. Recurrent infections   3. Seasonal and perennial allergic rhinitis   4. Flexural atopic dermatitis   5. Rash     Patient Instructions  Asthma Begin Symbicort  (Bryna) 80-2 puffs once a day with a spacer to prevent cough or wheeze Continue albuterol  2 puffs once every 4 hours as needed for cough or wheeze You may use albuterol  2 puffs 5 to 15 minutes before  activity to decrease cough or wheeze For asthma flare, increase Symbicort  80 to 2 puffs twice a day for 1-2 weeks or until cough and wheeze free, then return to original dosing  Allergic rhinitis Continue allergen avoidance measures directed toward grass pollen, weed pollen, ragweed pollen, tree pollen, indoor mold, outdoor mold, dust mite, cat, dog, and cockroach as listed below Codes provided to call insurance company about cost of allergen immunotherapy Begin Claritin (loratidine) 10 mg or Zyrtec (cetirizine) 10 mg once a day as needed for runny nose or itch Consider saline nasal rinses as needed for nasal symptoms. Use this before any medicated nasal sprays for best result  Atopic dermatitis Continue a twice a day moisturizing routine Continue triamcinolone  0.1% ointment to red stubborn areas below your face up to twice a day as needed. Do not use this medication longer than 2 weeks in a row.  Recurrent infection Keep track of infections, antibiotic use, and steroid use  Call the clinic if this treatment plan is not working well for you.  Follow up in 6 months or sooner if needed.  Return in about 6 months (around 10/24/2024), or if symptoms worsen or fail to improve.    Thank you for the opportunity to care for this patient.  Please do not hesitate to contact me with questions.  Arlean Mutter, FNP [facility]           [1]  Allergies Allergen Reactions   Clindamycin/Lincomycin Hives   Keflex [Cephalexin] Hives   Propylene Glycol Hives    Per (+)skin testing due to evaluation of  bad break outs on hands/feet- parent was told to avoid per d/w Mom   Other Swelling    almonds   Nickel Swelling   "

## 2024-04-26 ENCOUNTER — Encounter: Payer: Self-pay | Admitting: Family Medicine

## 2024-04-26 ENCOUNTER — Other Ambulatory Visit: Payer: Self-pay

## 2024-04-26 ENCOUNTER — Ambulatory Visit: Payer: Self-pay | Admitting: Family Medicine

## 2024-04-26 VITALS — BP 104/72 | HR 86 | Temp 98.4°F | Wt 132.2 lb

## 2024-04-26 DIAGNOSIS — R21 Rash and other nonspecific skin eruption: Secondary | ICD-10-CM | POA: Insufficient documentation

## 2024-04-26 DIAGNOSIS — B999 Unspecified infectious disease: Secondary | ICD-10-CM

## 2024-04-26 DIAGNOSIS — J302 Other seasonal allergic rhinitis: Secondary | ICD-10-CM | POA: Diagnosis not present

## 2024-04-26 DIAGNOSIS — J4540 Moderate persistent asthma, uncomplicated: Secondary | ICD-10-CM

## 2024-04-26 DIAGNOSIS — J3089 Other allergic rhinitis: Secondary | ICD-10-CM | POA: Diagnosis not present

## 2024-04-26 DIAGNOSIS — L2089 Other atopic dermatitis: Secondary | ICD-10-CM

## 2024-04-26 MED ORDER — BUDESONIDE-FORMOTEROL FUMARATE 80-4.5 MCG/ACT IN AERO: 2.0000 | INHALATION_SPRAY | Freq: Two times a day (BID) | RESPIRATORY_TRACT | 5 refills | Status: DC

## 2024-10-25 ENCOUNTER — Ambulatory Visit: Admitting: Family Medicine
